# Patient Record
Sex: Male | Born: 1965 | Hispanic: No | Marital: Married | Smoking: Former smoker
Health system: Southern US, Community
[De-identification: ages and names within clinical notes are randomized; demographics above are authoritative.]

## PROBLEM LIST (undated history)

## (undated) DIAGNOSIS — K219 Gastro-esophageal reflux disease without esophagitis: Secondary | ICD-10-CM

## (undated) DIAGNOSIS — F319 Bipolar disorder, unspecified: Secondary | ICD-10-CM

## (undated) DIAGNOSIS — M1990 Unspecified osteoarthritis, unspecified site: Secondary | ICD-10-CM

## (undated) DIAGNOSIS — I2510 Atherosclerotic heart disease of native coronary artery without angina pectoris: Secondary | ICD-10-CM

## (undated) DIAGNOSIS — E785 Hyperlipidemia, unspecified: Secondary | ICD-10-CM

## (undated) DIAGNOSIS — C801 Malignant (primary) neoplasm, unspecified: Secondary | ICD-10-CM

## (undated) HISTORY — PX: KNEE CARTILAGE SURGERY: SHX688

## (undated) HISTORY — PX: COLONOSCOPY W/ BIOPSIES: SHX1374

## (undated) HISTORY — PX: PROSTATECTOMY: SHX69

## (undated) HISTORY — DX: Hyperlipidemia, unspecified: E78.5

## (undated) HISTORY — DX: Atherosclerotic heart disease of native coronary artery without angina pectoris: I25.10

---

## 1999-04-30 ENCOUNTER — Emergency Department (HOSPITAL_COMMUNITY): Admission: EM | Admit: 1999-04-30 | Discharge: 1999-04-30 | Payer: Self-pay | Admitting: Emergency Medicine

## 2002-03-09 ENCOUNTER — Encounter: Admission: RE | Admit: 2002-03-09 | Discharge: 2002-03-09 | Payer: Self-pay | Admitting: Neurology

## 2002-03-09 ENCOUNTER — Encounter: Payer: Self-pay | Admitting: Neurology

## 2006-09-05 ENCOUNTER — Ambulatory Visit (HOSPITAL_COMMUNITY): Admission: RE | Admit: 2006-09-05 | Discharge: 2006-09-05 | Payer: Self-pay | Admitting: *Deleted

## 2006-09-05 ENCOUNTER — Encounter (INDEPENDENT_AMBULATORY_CARE_PROVIDER_SITE_OTHER): Payer: Self-pay | Admitting: *Deleted

## 2007-09-17 ENCOUNTER — Encounter (INDEPENDENT_AMBULATORY_CARE_PROVIDER_SITE_OTHER): Payer: Self-pay | Admitting: *Deleted

## 2007-09-17 ENCOUNTER — Ambulatory Visit (HOSPITAL_COMMUNITY): Admission: RE | Admit: 2007-09-17 | Discharge: 2007-09-17 | Payer: Self-pay | Admitting: *Deleted

## 2008-07-04 ENCOUNTER — Inpatient Hospital Stay (HOSPITAL_COMMUNITY): Admission: RE | Admit: 2008-07-04 | Discharge: 2008-07-05 | Payer: Self-pay | Admitting: Urology

## 2008-07-04 ENCOUNTER — Encounter: Payer: Self-pay | Admitting: Urology

## 2010-04-17 ENCOUNTER — Other Ambulatory Visit: Payer: Self-pay | Admitting: Registered Nurse

## 2010-04-23 LAB — TYPE AND SCREEN
ABO/RH(D): A POS
Antibody Screen: NEGATIVE

## 2010-04-23 LAB — CBC
HCT: 40.5 % (ref 39.0–52.0)
Hemoglobin: 13.9 g/dL (ref 13.0–17.0)
RBC: 4.39 MIL/uL (ref 4.22–5.81)
RDW: 12.8 % (ref 11.5–15.5)
WBC: 5.9 10*3/uL (ref 4.0–10.5)

## 2010-04-23 LAB — BASIC METABOLIC PANEL
Calcium: 9.3 mg/dL (ref 8.4–10.5)
GFR calc Af Amer: >60 mL/min (ref >60)
GFR calc non Af Amer: >60 mL/min (ref >60)
Potassium: 4.2 mEq/L (ref 3.5–5.1)
Sodium: 137 mEq/L (ref 135–145)

## 2010-04-23 LAB — HEMOGLOBIN AND HEMATOCRIT, BLOOD
HCT: 36.3 % — ABNORMAL LOW (ref 39.0–52.0)
Hemoglobin: 12.1 g/dL — ABNORMAL LOW (ref 13.0–17.0)

## 2010-04-23 LAB — ABO/RH: ABO/RH(D): A POS

## 2010-05-29 NOTE — Op Note (Signed)
NAMESIDDH, VANDEVENTER NO.:  [id number]   MEDICAL RECORD NO.:  [id number]          PATIENT TYPE:  AMB   LOCATION:  ENDO                         FACILITY:  [facility]   PHYSICIAN:  Georgiana Spinner, M.D.    DATE OF BIRTH:  09-23-1965   DATE OF PROCEDURE:  09/05/2006  DATE OF DISCHARGE:                               OPERATIVE REPORT   PROCEDURE:  Upper endoscopy.   INDICATIONS:  Hemoccult positivity.   ANESTHESIA:  Fentanyl 100 mcg, Versed 8 mg.   PROCEDURE:  With the patient mildly sedated in the left lateral  decubitus position, the Pentax videoscopic endoscope was inserted in the  mouth and passed under direct vision through the esophagus which  appeared normal.  I went into the stomach, fundus, body, antrum appeared  normal as did duodenal bulb, second portion duodenum.  From this point,  the endoscope was slowly withdrawn taking circumferential views of  duodenal mucosa until the endoscope had been pulled back into the  stomach, placed in retroflexion to view the stomach from below.  The  endoscope was straightened and withdrawn taking circumferential views of  the remaining gastric and esophageal mucosa.  The patient's vital signs  and pulse oximeter remained stable.  The patient tolerated the procedure  well without apparent complication.   FINDINGS:  Negative examination.   PLAN:  Proceed to colonoscopy.           ______________________________  Georgiana Spinner, M.D.     GMO/MEDQ  D:  09/05/2006  T:  09/06/2006  Job:  [id number]

## 2010-05-29 NOTE — Op Note (Signed)
NAMEZACHAREY, JENSEN NO.:  [id number]   MEDICAL RECORD NO.:  [id number]          PATIENT TYPE:  AMB   LOCATION:  ENDO                         FACILITY:  [facility]   PHYSICIAN:  Georgiana Spinner, M.D.    DATE OF BIRTH:  03-14-1965   DATE OF PROCEDURE:  09/05/2006  DATE OF DISCHARGE:                               OPERATIVE REPORT   PROCEDURE:  Colonoscopy.   INDICATIONS:  Rectal bleeding.   ANESTHESIA:  Versed 2 mg.   PROCEDURE:  With the patient mildly sedated in the left lateral  decubitus position, rectal examination was performed.  Prostate was  normal.  Subsequently, the Pentax videoscopic colonoscope was inserted  into the rectum and passed under direct vision to the cecum identified  by ileocecal valve and appendiceal orifice, both of which were  photographed.  From this point the colonoscope was slowly withdrawn  taking circumferential views of the colonic mucosa stopping in the  descending colon where three successive polyps were noted and very close  to each other and all were photographed and all were removed using snare  cautery technique setting of 20/150 blended current and all of them were  retrieved for pathology, the first by suctioning through the endoscope  and the second and third by removing the polypoid tissue with a Lucina Mellow  retrieval net, all were placed in the same bottle and the endoscope was  subsequently then reinserted to this level and withdrawn taking  circumferential views of the remaining colonic mucosa, stopping in the  rectum which appeared normal on direct and showed small hemorrhoids on  retroflexed view.  The endoscope was straightened and withdrawn.  The  patient's vital signs and pulse oximeter remained stable.  The patient  tolerated the procedure well without apparent complications.   FINDINGS:  Polyps of descending colon.  Internal hemorrhoids.  Await  biopsy report.  The patient will call me for results and follow-up with  me  as an outpatient.  Of note, blood was seen emanating from the largest  polyp, indicating this probably was cause of the hemoccult positivity.           ______________________________  Georgiana Spinner, M.D.     GMO/MEDQ  D:  09/05/2006  T:  09/06/2006  Job:  [id number]

## 2010-05-29 NOTE — Op Note (Signed)
NAMEDAMIER, DISANO NO.:  [id number]   MEDICAL RECORD NO.:  [id number]          PATIENT TYPE:  INP   LOCATION:  0003                         FACILITY:  [facility]   PHYSICIAN:  Excell Seltzer. Annabell Howells, M.D.    DATE OF BIRTH:  [birth year]   DATE OF PROCEDURE:  07/04/2008  DATE OF DISCHARGE:                               OPERATIVE REPORT   PROCEDURE:  Robot-assisted laparoscopic radical prostatectomy.   PREOPERATIVE DIAGNOSIS:  T1c, Gleason 6 adenocarcinoma the prostate.   POSTOPERATIVE DIAGNOSIS:  T1c, Gleason 6 adenocarcinoma the prostate.   SURGEON:  Excell Seltzer. Annabell Howells, M.D.   ASSISTANT:  Heloise Purpura, MD.   ANESTHESIA:  General.   SPECIMENS:  Prostate and seminal vesicles.   DRAINS:  A 20-French Foley catheter and Blake wound drain.   ESTIMATED BLOOD LOSS:  200 mL.   COMPLICATIONS:  None.   INDICATIONS:  Dustin Conrad is a 45 year old white male who was initially  referred for an elevated PSA and was found to have a T1c, Gleason 6  prostate cancer involving one of twelve cores at the left apex.  His  prostate volume is 50 mL.  After discussing the options he has elected  robotic a prostatectomy.   FINDINGS OF PROCEDURE:  The patient was given a routine bowel prep.  He  was taken to the operating room where he was placed on the table in a  supine position.  A general anesthetic was induced.  He was given a gram  of Ancef.  He was then placed in the lithotomy position.  His arms were  tucked.  All pressure points were padded.  His abdomen was clipped and a  red rubber rectal catheter was placed and secured and attached to an  Asepto syringe.  He was then prepped with Betadine solution and then  placed in steep Trendelenburg position with Mayo stand over his head.  He was then draped in the usual sterile fashion.  A 20-French Foley  catheter with a 30 mL balloon was placed.  The bladder was drained and  the catheter was placed to irrigation tubing.   At this point, the  initial port site was marked 18 cm superior to the  pubis, just to the left of the midline.  A 2 cm incision was made  through the skin and carried to subcutaneous tissues using the Bovie and  hemostat dissection.  Once the anterior rectus sheath was exposed it was  incised vertically.  We were in the midline, so the peritoneum was  immediately beneath the fascia.  The peritoneum was pierced with a  hemostat.  A finger was placed in the incision to ensure intraperitoneal  positioning.  A 12 mm port was then placed and the skin was tightened  around the port using zero Vicryl figure-of-eight stitch.  The  laparoscopic camera was then placed.  Inspection revealed no adhesions.  The abdomen was insufflated.  The additional port sites were measured  and marked in the standard distribution.  The chosen sites were  infiltrated with local anesthetic and the skin was incised with  a knife,  followed by a Bovie to open the subcutaneous space then sequentially two  8 mm ports were placed on the left.  An 8 mm robot port was placed on  the right along the 12 mm working port laterally and a 5 mm working port  superiorly.  Once all ports were in position the robot was prepared and  brought onto the field and docked.  The cautery scissors were placed in  the right hand.  The PK dissector was placed in the medial left and  working port and a ProGrasp was placed in the lateral left working port.  I then moved to the console and initiated the dissection.  The  peritoneum was incised over the anterior abdominal wall and the  obliterated umbilical arteries and the rectus were divided.  The bladder  was then dissected off the anterior bladder wall.  The bladder was then  filled with fluid to assist with dissection.  Once the pubis was  identified the bladder was drained and the third arm was used to provide  cephalad traction on the bladder.  The anterior prostatic space was  developed and the anterior  prostate was defatted.  The right endopelvic  fascia was incised and separated exposing the lateral prostatic margin.  This was then repeated on the left side.  The puboprostatic ligaments  were taken down and further apical dissection was performed to expose  the dorsal vein complex bilaterally.  Once adequate exposure had been  obtained an Endo-GIA was used to divide the dorsal vein complex.  The  Foley catheter was then used to identify the bladder neck which was  incised transversely.  Once the Foley had been identified at the bladder  neck the balloon was deflated and the catheter was then withdrawn,  replaced and then brought back in to provide anterior traction on the  prostate.  The posterior bladder neck was then divided down to expose  the structures.  The left vas of ampulla was identified, grasped and  dissected proximally and divided and then used to provide anterior  traction in place with of the Foley.  The left seminal vesicle was then  dissected out once the tip was identified.  A clip was placed across the  remaining attachments and the seminal vesicle was first reflected  anteriorly.  The right ampulla of the vas was dissected out divided as  followed by the right seminal vesicle which as once again was controlled  at the tip with a clip.  Once the structures had been dissected out  Denonvilliers sheath was incised, exposing the plane between the  prostate and the rectum which was developed out to the apex and  laterally.  Once this plane had been developed, the structures were  released and the nerve-sparing dissection was performed.  Initially, the  right nerve spare dissection was performed incising the periprostatic  fascia and developing the plane between the prostate and the  neurovascular bundle from base to apex.  Once this dissection been  completed it was repeated on the left side in an identical fashion.  Once both nerve spares had been performed the left  prostatic pedicle was  taken down using large clips with great care being taken to avoid entry  into the prostate or incorporation of the neurovascular bundle.  Once  the pedicle had been divided some additional lateral attachments were  taken down with sharp and blunt dissection toward the apex.  We then  took down  the right prostatic pedicle once again with great care being  taken to avoid incorporation of the neurovascular bundle or entry into  the prostate.  At this point, we turned our attention to the apex where  the residual dorsal vein complex was divided using the cautery scissors.  The urethra was divided anteriorly with cold shears.  The Foley catheter  which had been replaced was then pulled back and the posterior urethra  was divided with cold scissors.  The residual apical attachments were  taken down.  There were large veins that came in laterally at the apex.  These were controlled near the prostate with bipolar cautery.  At this  point, the prostate was removed from the field.  There was a small  arterial bleeder from the left lateral attachments.  This was controlled  with a very superficial 4-0 Vicryl figure-of-eight suture with great  care taken not to deeply incorporate the neurovascular bundle.  At this  point, the bladder was irrigated and aspirated.  No evidence of rectal  injury was identified.   A 2-0 Vicryl was then brought onto the field and was used to place a  stitch through the posterior edge of Denonvilliers fascia, the posterior  bladder neck and the posterior urethra and rectourethral Allis complex.  This was tied using a slip-knot to bring the bladder neck and urethra in  approximation.  Once this suture had been secured a two-tone 4-0  Monocryl was brought onto the field and was used to perform the standard  running vesicourethral anastomosis.  Once the anastomosis had been  completed and tied a fresh 20-French coude Foley catheter was inserted.  The  bladder was irrigated with an Asepto syringe and a small clot was  aspirated, but no anastomotic leak was identified.  At this point, a #19  Blake drain was placed through the left lateral port and the port was  removed.  The robot was then undocked once all hemostasis was assured  and the prostate was placed in an entrapment sac.  Once the prostate had  been placed in the entrapment sac the 12 mm assistant port was closed  using a needle passer and zero Vicryl.  The remaining ports were removed  under direct vision.  The periumbilical incision was then extended  sufficiently to remove the prostate and the anterior rectus fascia was  closed using a running #1 Vicryl suture.  All port sites were  infiltrated with local anesthetic and then were closed with clips.  The  drain was secured with a 3-0 Vicryl and placed to bulb suction.  The  drapes were removed.  Dressings were applied.  The Foley catheter was  irrigated once again with blue urine return from the indigo carmine  given during procedure and then the catheter was placed to straight  drainage and secured to the patient's thigh.  He was taken down from  lithotomy position.  He had moved out of the Trendelenburg position  prior to closure.  His anesthetic was reversed and was moved to the  recovery room in stable condition.  There were no complications.      Excell Seltzer. Annabell Howells, M.D.  Electronically Signed     JJW/MEDQ  D:  07/04/2008  T:  07/04/2008  Job:  [id number]   cc:   Soyla Murphy. Renne Crigler, M.D.  Fax: [phone]

## 2010-05-29 NOTE — Op Note (Signed)
NAMEBOCEPHUS, CALI NO.:  [id number]   MEDICAL RECORD NO.:  [id number]          PATIENT TYPE:  AMB   LOCATION:  ENDO                         FACILITY:  [facility]   PHYSICIAN:  Georgiana Spinner, M.D.    DATE OF BIRTH:  [birth year]   DATE OF PROCEDURE:  09/17/2007  DATE OF DISCHARGE:                               OPERATIVE REPORT   PROCEDURE:  Colonoscopy.   INDICATIONS:  Colon polyps.   ANESTHESIA:  Fentanyl 175 mcg, Versed 15 mg.   PROCEDURE:  With the patient mildly sedated in the left lateral  decubitus position, a rectal exam was performed which was unremarkable  to my exam.  Subsequently, the Pentax videoscopic colonoscope was  inserted in the rectum and passed under direct vision to the cecum  identified by the ileocecal valve and appendiceal orifice, both which  were photographed.  From this point, the colonoscope was slowly  withdrawn taking circumferential views of colonic mucosa stopping in the  descending colon near where a scar was seen from previous polypectomy  site presumably, and an area was noted and explored this fairly closely,  and I thought it was a  polyp stalk possibly from previous polyps, but I  could not see a polyp head.  I biopsied the top of this area.  It had  mottled appearance to it, and once accomplished, we had explored it  fairly thoroughly.  I did not see a polyp top on thorough exam.  I  withdrew the colonoscope taking circumferential views again of the  remaining colonic mucosa, stopping in the rectum, which appeared normal  on direct and showed hemorrhoids on retroflexed view.  The endoscope was  straightened and withdrawn.  The patient's vital signs and pulse  oximeter remained stable.  The patient tolerated the procedure well  without apparent complication.   FINDINGS:  Probably a polyp stalk but no residual polyp tissue seen at  this point.  Await biopsy report.  The patient will call me for results  and follow up with me  as needed as an outpatient.           ______________________________  Georgiana Spinner, M.D.     GMO/MEDQ  D:  09/17/2007  T:  09/17/2007  Job:  [id number]

## 2010-09-07 ENCOUNTER — Other Ambulatory Visit: Payer: Self-pay | Admitting: Gastroenterology

## 2013-06-10 ENCOUNTER — Other Ambulatory Visit: Payer: Self-pay

## 2015-06-30 ENCOUNTER — Emergency Department (HOSPITAL_COMMUNITY)
Admission: EM | Admit: 2015-06-30 | Discharge: 2015-06-30 | Disposition: A | Discharge status: Home / Self Care | Payer: BC Managed Care – PPO | Attending: Emergency Medicine | Admitting: Emergency Medicine

## 2015-06-30 ENCOUNTER — Emergency Department (HOSPITAL_COMMUNITY): Payer: BC Managed Care – PPO

## 2015-06-30 ENCOUNTER — Encounter (HOSPITAL_COMMUNITY): Payer: Self-pay | Admitting: Emergency Medicine

## 2015-06-30 DIAGNOSIS — S42022A Displaced fracture of shaft of left clavicle, initial encounter for closed fracture: Secondary | ICD-10-CM | POA: Insufficient documentation

## 2015-06-30 DIAGNOSIS — Y92828 Other wilderness area as the place of occurrence of the external cause: Secondary | ICD-10-CM | POA: Insufficient documentation

## 2015-06-30 DIAGNOSIS — Z791 Long term (current) use of non-steroidal anti-inflammatories (NSAID): Secondary | ICD-10-CM | POA: Diagnosis not present

## 2015-06-30 DIAGNOSIS — M1990 Unspecified osteoarthritis, unspecified site: Secondary | ICD-10-CM | POA: Diagnosis not present

## 2015-06-30 DIAGNOSIS — Z7951 Long term (current) use of inhaled steroids: Secondary | ICD-10-CM | POA: Diagnosis not present

## 2015-06-30 DIAGNOSIS — Y999 Unspecified external cause status: Secondary | ICD-10-CM | POA: Diagnosis not present

## 2015-06-30 DIAGNOSIS — S4992XA Unspecified injury of left shoulder and upper arm, initial encounter: Secondary | ICD-10-CM | POA: Diagnosis present

## 2015-06-30 DIAGNOSIS — Z79899 Other long term (current) drug therapy: Secondary | ICD-10-CM | POA: Diagnosis not present

## 2015-06-30 DIAGNOSIS — S42002A Fracture of unspecified part of left clavicle, initial encounter for closed fracture: Secondary | ICD-10-CM

## 2015-06-30 DIAGNOSIS — F319 Bipolar disorder, unspecified: Secondary | ICD-10-CM | POA: Diagnosis not present

## 2015-06-30 DIAGNOSIS — Y9355 Activity, bike riding: Secondary | ICD-10-CM | POA: Insufficient documentation

## 2015-06-30 HISTORY — DX: Bipolar disorder, unspecified: F31.9

## 2015-06-30 HISTORY — DX: Unspecified osteoarthritis, unspecified site: M19.90

## 2015-06-30 MED ORDER — HYDROCODONE-ACETAMINOPHEN 5-325 MG PO TABS: 1.0000 | ORAL_TABLET | Freq: Four times a day (QID) | ORAL | Status: DC | PRN

## 2015-06-30 MED ORDER — HYDROCODONE-ACETAMINOPHEN 5-325 MG PO TABS
1.0000 | ORAL_TABLET | Freq: Once | ORAL | Status: AC
  Administered 2015-06-30: 1 via ORAL
  Filled 2015-06-30: qty 1
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
## 2015-06-30 NOTE — ED Notes (Signed)
C collar applied and ice given per Ragland.
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
## 2015-06-30 NOTE — Discharge Instructions (Signed)
Clavicle Fracture A clavicle fracture is a broken collarbone. The collarbone is the long bone that connects your shoulder to your rib cage. A broken collarbone may be treated with a sling, a wrap, or surgery. Treatment depends on whether the broken ends of the bone are out of place or not. HOME CARE  Put ice on the injured area:  Put ice in a plastic bag.  Place a towel between your skin and the bag.  Leave the ice on for 20 minutes, 2-3 times a day.  If you have a wrap or splint:  Wear it all the time, and remove it only to take a bath or shower.  When you bathe or shower, keep your shoulder in the same place as when the sling or wrap is on.  Do not lift your arm.  If you have a wrap:  Another person must tighten it every day.  It should be tight enough to hold your shoulders back.  Make sure you have enough room to put your pointer finger between your body and the strap.  Loosen the wrap right away if you cannot feel your arm or your hands tingle.  Only take medicines as told by your doctor.  Avoid activities that make the injury or pain worse for 4-6 weeks after surgery.  Keep all follow-up appointments. GET HELP IF:  Your medicine is not making you feel less pain.  Your medicine is not making swelling better. GET HELP RIGHT AWAY IF:   Your cannot feel your arm.  Your arm is cold.  Your arm is a lighter color than normal. MAKE SURE YOU:   Understand these instructions.  Will watch your condition.  Will get help right away if you are not doing well or get worse.   This information is not intended to replace advice given to you by your health care provider. Make sure you discuss any questions you have with your health care provider.  Call orthopedics for follow up. Use sling at all times. Take pain meds as directed.    Document Released: 06/19/2007 Document Revised: 01/05/2013 Document Reviewed: 11/23/2012 Elsevier Interactive Patient Education [employer]

## 2015-07-04 ENCOUNTER — Other Ambulatory Visit: Payer: Self-pay | Admitting: Orthopaedic Surgery

## 2015-07-04 MED ORDER — CEFAZOLIN SODIUM-DEXTROSE 2-4 GM/100ML-% IV SOLN
2.0000 g | INTRAVENOUS | Status: AC
  Administered 2015-07-05: 2 g via INTRAVENOUS
  Filled 2015-07-04: qty 100

## 2015-07-04 NOTE — Progress Notes (Signed)
Please complete pt assessment on DOS. Left voice message with pre-op instructions on pt phone. Pt made aware to stop taking Aspirin, vitamins, fish oil and herbal medications. Do not take any NSAIDs ie: Ibuprofen, Advil, Naproxen, BC and Goody Powder, Mobic or any medication containing Aspirin.

## 2015-07-05 ENCOUNTER — Ambulatory Visit (HOSPITAL_COMMUNITY): Payer: BC Managed Care – PPO | Admitting: Anesthesiology

## 2015-07-05 ENCOUNTER — Encounter (HOSPITAL_COMMUNITY): Payer: Self-pay | Admitting: *Deleted

## 2015-07-05 ENCOUNTER — Ambulatory Visit (HOSPITAL_COMMUNITY)
Admission: RE | Admit: 2015-07-05 | Discharge: 2015-07-06 | Disposition: A | Discharge status: Home / Self Care | Payer: BC Managed Care – PPO | Source: Ambulatory Visit | Attending: Orthopaedic Surgery | Admitting: Orthopaedic Surgery

## 2015-07-05 ENCOUNTER — Encounter (HOSPITAL_COMMUNITY)
Admission: RE | Disposition: A | Discharge status: Home / Self Care | Payer: Self-pay | Source: Ambulatory Visit | Attending: Orthopaedic Surgery

## 2015-07-05 ENCOUNTER — Ambulatory Visit (HOSPITAL_COMMUNITY): Payer: BC Managed Care – PPO

## 2015-07-05 DIAGNOSIS — Z87891 Personal history of nicotine dependence: Secondary | ICD-10-CM | POA: Diagnosis not present

## 2015-07-05 DIAGNOSIS — Z419 Encounter for procedure for purposes other than remedying health state, unspecified: Secondary | ICD-10-CM

## 2015-07-05 DIAGNOSIS — M47892 Other spondylosis, cervical region: Secondary | ICD-10-CM | POA: Insufficient documentation

## 2015-07-05 DIAGNOSIS — S42009A Fracture of unspecified part of unspecified clavicle, initial encounter for closed fracture: Secondary | ICD-10-CM | POA: Diagnosis present

## 2015-07-05 DIAGNOSIS — S42002A Fracture of unspecified part of left clavicle, initial encounter for closed fracture: Secondary | ICD-10-CM | POA: Insufficient documentation

## 2015-07-05 HISTORY — DX: Malignant (primary) neoplasm, unspecified: C80.1

## 2015-07-05 HISTORY — PX: ORIF CLAVICULAR FRACTURE: SHX5055

## 2015-07-05 HISTORY — DX: Gastro-esophageal reflux disease without esophagitis: K21.9

## 2015-07-05 LAB — CBC
HEMATOCRIT: 39 % (ref 39.0–52.0)
HEMOGLOBIN: 12.2 g/dL — AB (ref 13.0–17.0)
MCH: 29.5 pg (ref 26.0–34.0)
MCHC: 31.3 g/dL (ref 30.0–36.0)
MCV: 94.4 fL (ref 78.0–100.0)
Platelets: 287 10*3/uL (ref 150–400)
RBC: 4.13 MIL/uL — ABNORMAL LOW (ref 4.22–5.81)
RDW: 13.3 % (ref 11.5–15.5)
WBC: 6.1 10*3/uL (ref 4.0–10.5)

## 2015-07-05 LAB — COMPREHENSIVE METABOLIC PANEL
ALK PHOS: 88 U/L (ref 38–126)
ALT: 37 U/L (ref 17–63)
ANION GAP: 8 (ref 5–15)
AST: 35 U/L (ref 15–41)
Albumin: 3.7 g/dL (ref 3.5–5.0)
BILIRUBIN TOTAL: 0.9 mg/dL (ref 0.3–1.2)
BUN: 17 mg/dL (ref 6–20)
CALCIUM: 9.4 mg/dL (ref 8.9–10.3)
CO2: 24 mmol/L (ref 22–32)
Chloride: 105 mmol/L (ref 101–111)
Creatinine, Ser: 0.95 mg/dL (ref 0.61–1.24)
GFR calc Af Amer: >60 mL/min (ref >60)
Glucose, Bld: 86 mg/dL (ref 65–99)
POTASSIUM: 4.7 mmol/L (ref 3.5–5.1)
Sodium: 137 mmol/L (ref 135–145)
TOTAL PROTEIN: 6.4 g/dL — AB (ref 6.5–8.1)

## 2015-07-05 LAB — PROTIME-INR
INR: 1.09 (ref 0.00–1.49)
PROTHROMBIN TIME: 14.3 s (ref 11.6–15.2)

## 2015-07-05 SURGERY — OPEN REDUCTION INTERNAL FIXATION (ORIF) CLAVICULAR FRACTURE
Anesthesia: General | Laterality: Left

## 2015-07-05 MED ORDER — ONDANSETRON HCL 4 MG/2ML IJ SOLN: 4.0000 mg | Freq: Once | INTRAMUSCULAR | Status: DC | PRN

## 2015-07-05 MED ORDER — MIDAZOLAM HCL 2 MG/2ML IJ SOLN
INTRAMUSCULAR | Status: AC
  Administered 2015-07-05: 1 mg
  Filled 2015-07-05: qty 2

## 2015-07-05 MED ORDER — BUPIVACAINE HCL (PF) 0.25 % IJ SOLN
INTRAMUSCULAR | Status: AC
  Filled 2015-07-05: qty 30

## 2015-07-05 MED ORDER — OXYCODONE HCL 5 MG PO TABS: 5.0000 mg | ORAL_TABLET | Freq: Once | ORAL | Status: DC | PRN

## 2015-07-05 MED ORDER — LIDOCAINE HCL (CARDIAC) 20 MG/ML IV SOLN
INTRAVENOUS | Status: DC | PRN
  Administered 2015-07-05: 80 mg via INTRAVENOUS

## 2015-07-05 MED ORDER — OXYCODONE HCL 5 MG PO TABS
5.0000 mg | ORAL_TABLET | ORAL | Status: DC | PRN
  Administered 2015-07-05 – 2015-07-06 (×4): 10 mg via ORAL
  Filled 2015-07-05 (×3): qty 2

## 2015-07-05 MED ORDER — HYDROMORPHONE HCL 1 MG/ML IJ SOLN: 0.5000 mg | INTRAMUSCULAR | Status: DC | PRN

## 2015-07-05 MED ORDER — METHOCARBAMOL 1000 MG/10ML IJ SOLN: 500.0000 mg | Freq: Four times a day (QID) | INTRAVENOUS | Status: DC | PRN

## 2015-07-05 MED ORDER — ACETAMINOPHEN 650 MG RE SUPP: 650.0000 mg | Freq: Four times a day (QID) | RECTAL | Status: DC | PRN

## 2015-07-05 MED ORDER — MENTHOL 3 MG MT LOZG: 1.0000 | LOZENGE | OROMUCOSAL | Status: DC | PRN

## 2015-07-05 MED ORDER — LACTATED RINGERS IV SOLN
INTRAVENOUS | Status: DC
  Administered 2015-07-05 (×2): via INTRAVENOUS

## 2015-07-05 MED ORDER — CHLORHEXIDINE GLUCONATE 4 % EX LIQD: 60.0000 mL | Freq: Once | CUTANEOUS | Status: DC

## 2015-07-05 MED ORDER — OXYCODONE-ACETAMINOPHEN 5-325 MG PO TABS: 1.0000 | ORAL_TABLET | Freq: Four times a day (QID) | ORAL | Status: DC | PRN

## 2015-07-05 MED ORDER — SUGAMMADEX SODIUM 200 MG/2ML IV SOLN
INTRAVENOUS | Status: AC
  Filled 2015-07-05: qty 2

## 2015-07-05 MED ORDER — ACETAMINOPHEN 325 MG PO TABS
ORAL_TABLET | ORAL | Status: AC
  Administered 2015-07-05: 650 mg via ORAL
  Filled 2015-07-05: qty 2

## 2015-07-05 MED ORDER — METHOCARBAMOL 500 MG PO TABS
500.0000 mg | ORAL_TABLET | Freq: Four times a day (QID) | ORAL | Status: DC | PRN
  Administered 2015-07-05: 500 mg via ORAL
  Filled 2015-07-05: qty 1

## 2015-07-05 MED ORDER — FENTANYL CITRATE (PF) 100 MCG/2ML IJ SOLN
INTRAMUSCULAR | Status: AC
  Administered 2015-07-05: 100 ug
  Filled 2015-07-05: qty 2

## 2015-07-05 MED ORDER — ONDANSETRON HCL 4 MG/2ML IJ SOLN
INTRAMUSCULAR | Status: AC
  Filled 2015-07-05: qty 2

## 2015-07-05 MED ORDER — FLUTICASONE PROPIONATE 50 MCG/ACT NA SUSP
1.0000 | Freq: Every day | NASAL | Status: DC
  Filled 2015-07-05: qty 16

## 2015-07-05 MED ORDER — FENTANYL CITRATE (PF) 100 MCG/2ML IJ SOLN
INTRAMUSCULAR | Status: DC | PRN
  Administered 2015-07-05: 50 ug via INTRAVENOUS
  Administered 2015-07-05 (×2): 100 ug via INTRAVENOUS

## 2015-07-05 MED ORDER — ONDANSETRON HCL 4 MG/2ML IJ SOLN
INTRAMUSCULAR | Status: DC | PRN
  Administered 2015-07-05: 4 mg via INTRAVENOUS

## 2015-07-05 MED ORDER — HYDROMORPHONE HCL 1 MG/ML IJ SOLN: 0.2500 mg | INTRAMUSCULAR | Status: DC | PRN

## 2015-07-05 MED ORDER — MIDAZOLAM HCL 2 MG/2ML IJ SOLN
INTRAMUSCULAR | Status: AC
Start: 2015-07-05 — End: 2015-07-05
  Filled 2015-07-05: qty 2

## 2015-07-05 MED ORDER — ROCURONIUM BROMIDE 100 MG/10ML IV SOLN
INTRAVENOUS | Status: DC | PRN
  Administered 2015-07-05: 50 mg via INTRAVENOUS

## 2015-07-05 MED ORDER — ONDANSETRON HCL 4 MG/2ML IJ SOLN: 4.0000 mg | Freq: Four times a day (QID) | INTRAMUSCULAR | Status: DC | PRN

## 2015-07-05 MED ORDER — BUPIVACAINE HCL (PF) 0.25 % IJ SOLN
INTRAMUSCULAR | Status: DC | PRN
  Administered 2015-07-05: 20 mL

## 2015-07-05 MED ORDER — ONDANSETRON HCL 4 MG PO TABS: 4.0000 mg | ORAL_TABLET | Freq: Four times a day (QID) | ORAL | Status: DC | PRN

## 2015-07-05 MED ORDER — LITHIUM CARBONATE 300 MG PO CAPS
300.0000 mg | ORAL_CAPSULE | Freq: Two times a day (BID) | ORAL | Status: DC
  Administered 2015-07-05: 300 mg via ORAL
  Filled 2015-07-05 (×2): qty 1

## 2015-07-05 MED ORDER — PANTOPRAZOLE SODIUM 40 MG PO TBEC
40.0000 mg | DELAYED_RELEASE_TABLET | Freq: Every day | ORAL | Status: DC
  Filled 2015-07-05: qty 1

## 2015-07-05 MED ORDER — LACTATED RINGERS IV SOLN
INTRAVENOUS | Status: DC | PRN
Start: 2015-07-05 — End: 2015-07-05
  Administered 2015-07-05 (×2): via INTRAVENOUS

## 2015-07-05 MED ORDER — FENTANYL CITRATE (PF) 250 MCG/5ML IJ SOLN
INTRAMUSCULAR | Status: AC
  Filled 2015-07-05: qty 5

## 2015-07-05 MED ORDER — OXYCODONE HCL 5 MG/5ML PO SOLN: 5.0000 mg | Freq: Once | ORAL | Status: DC | PRN

## 2015-07-05 MED ORDER — PROPOFOL 10 MG/ML IV BOLUS
INTRAVENOUS | Status: DC | PRN
  Administered 2015-07-05: 150 mg via INTRAVENOUS

## 2015-07-05 MED ORDER — SUGAMMADEX SODIUM 200 MG/2ML IV SOLN
INTRAVENOUS | Status: DC | PRN
  Administered 2015-07-05: 177 mg via INTRAVENOUS

## 2015-07-05 MED ORDER — PROPOFOL 10 MG/ML IV BOLUS
INTRAVENOUS | Status: AC
  Filled 2015-07-05: qty 20

## 2015-07-05 MED ORDER — CEFAZOLIN IN D5W 1 GM/50ML IV SOLN
1.0000 g | Freq: Three times a day (TID) | INTRAVENOUS | Status: AC
  Administered 2015-07-06 (×2): 1 g via INTRAVENOUS
  Filled 2015-07-05 (×2): qty 50

## 2015-07-05 MED ORDER — OXYCODONE HCL 5 MG PO TABS
ORAL_TABLET | ORAL | Status: AC
  Administered 2015-07-05: 10 mg via ORAL
  Filled 2015-07-05: qty 2

## 2015-07-05 MED ORDER — METOCLOPRAMIDE HCL 5 MG/ML IJ SOLN: 5.0000 mg | Freq: Three times a day (TID) | INTRAMUSCULAR | Status: DC | PRN

## 2015-07-05 MED ORDER — METOCLOPRAMIDE HCL 5 MG PO TABS
5.0000 mg | ORAL_TABLET | Freq: Three times a day (TID) | ORAL | Status: DC | PRN
Start: 2015-07-05 — End: 2015-07-06

## 2015-07-05 MED ORDER — PHENOL 1.4 % MT LIQD: 1.0000 | OROMUCOSAL | Status: DC | PRN

## 2015-07-05 MED ORDER — MIDAZOLAM HCL 5 MG/5ML IJ SOLN
INTRAMUSCULAR | Status: DC | PRN
  Administered 2015-07-05: 2 mg via INTRAVENOUS

## 2015-07-05 MED ORDER — METHOCARBAMOL 500 MG PO TABS
ORAL_TABLET | ORAL | Status: AC
  Administered 2015-07-05: 500 mg
  Filled 2015-07-05: qty 1

## 2015-07-05 MED ORDER — LORATADINE 10 MG PO TABS: 10.0000 mg | ORAL_TABLET | Freq: Every day | ORAL | Status: DC

## 2015-07-05 MED ORDER — ACETAMINOPHEN 325 MG PO TABS
650.0000 mg | ORAL_TABLET | Freq: Four times a day (QID) | ORAL | Status: DC | PRN
  Administered 2015-07-05: 650 mg via ORAL

## 2015-07-05 SURGICAL SUPPLY — 48 items
BIT DRILL 2.5X2.75 QC CALB (BIT) ×2 IMPLANT
COVER SURGICAL LIGHT HANDLE (MISCELLANEOUS) ×2 IMPLANT
DRAPE C-ARM 42X72 X-RAY (DRAPES) ×2 IMPLANT
DRAPE SURG 17X23 STRL (DRAPES) ×2 IMPLANT
DRAPE U-SHAPE 47X51 STRL (DRAPES) ×2 IMPLANT
ELECT REM PT RETURN 9FT ADLT (ELECTROSURGICAL) ×2
ELECTRODE REM PT RTRN 9FT ADLT (ELECTROSURGICAL) ×1 IMPLANT
GAUZE SPONGE 4X4 12PLY STRL (GAUZE/BANDAGES/DRESSINGS) ×2 IMPLANT
GAUZE XEROFORM 1X8 LF (GAUZE/BANDAGES/DRESSINGS) ×2 IMPLANT
GLOVE BIOGEL PI IND STRL 8 (GLOVE) ×2 IMPLANT
GLOVE BIOGEL PI INDICATOR 8 (GLOVE) ×2
GLOVE ORTHO TXT STRL SZ7.5 (GLOVE) ×4 IMPLANT
GOWN STRL REUS W/ TWL LRG LVL3 (GOWN DISPOSABLE) ×1 IMPLANT
GOWN STRL REUS W/ TWL XL LVL3 (GOWN DISPOSABLE) ×1 IMPLANT
GOWN STRL REUS W/TWL 2XL LVL3 (GOWN DISPOSABLE) ×2 IMPLANT
GOWN STRL REUS W/TWL LRG LVL3 (GOWN DISPOSABLE) ×1
GOWN STRL REUS W/TWL XL LVL3 (GOWN DISPOSABLE) ×1
KIT BASIN OR (CUSTOM PROCEDURE TRAY) ×2 IMPLANT
KIT ROOM TURNOVER OR (KITS) ×2 IMPLANT
MANIFOLD NEPTUNE II (INSTRUMENTS) ×2 IMPLANT
NEEDLE HYPO 25GX1X1/2 BEV (NEEDLE) IMPLANT
NS IRRIG 1000ML POUR BTL (IV SOLUTION) ×2 IMPLANT
PACK SHOULDER (CUSTOM PROCEDURE TRAY) ×2 IMPLANT
PAD ARMBOARD 7.5X6 YLW CONV (MISCELLANEOUS) ×4 IMPLANT
PLATE FIBULAR COMP LOCK 10H (Plate) ×2 IMPLANT
SCREW CORT 3.5X26 (Screw) ×1 IMPLANT
SCREW CORT T15 26X3.5XST LCK (Screw) ×1 IMPLANT
SCREW CORT T15 28X3.5XST LCK (Screw) ×1 IMPLANT
SCREW CORT T15 30X3.5XST LCK (Screw) ×1 IMPLANT
SCREW CORTICAL 3.5X28MM (Screw) ×1 IMPLANT
SCREW CORTICAL 3.5X30MM (Screw) ×1 IMPLANT
SCREW CORTICAL LOW PROF 3.5X20 (Screw) ×4 IMPLANT
SCREW LOCK CORT STAR 3.5X14 (Screw) ×2 IMPLANT
SCREW LOCK CORT STAR 3.5X16 (Screw) ×4 IMPLANT
SCREW LOCK CORT STAR 3.5X18 (Screw) ×2 IMPLANT
SCREW LOCK CORT STAR 3.5X22 (Screw) ×2 IMPLANT
SCREW LOW PROFILE 18MMX3.5MM (Screw) ×2 IMPLANT
SPONGE LAP 18X18 X RAY DECT (DISPOSABLE) ×2 IMPLANT
STRIP CLOSURE SKIN 1/2X4 (GAUZE/BANDAGES/DRESSINGS) ×2 IMPLANT
SUCTION FRAZIER HANDLE 10FR (MISCELLANEOUS) ×1
SUCTION TUBE FRAZIER 10FR DISP (MISCELLANEOUS) ×1 IMPLANT
SUT PROLENE 3 0 PS 1 (SUTURE) ×2 IMPLANT
SUT VIC AB 2-0 CT1 27 (SUTURE) ×1
SUT VIC AB 2-0 CT1 TAPERPNT 27 (SUTURE) ×1 IMPLANT
SUT VICRYL 0 CT 1 36IN (SUTURE) ×2 IMPLANT
SYR CONTROL 10ML LL (SYRINGE) IMPLANT
WATER STERILE IRR 1000ML POUR (IV SOLUTION) ×2 IMPLANT
YANKAUER SUCT BULB TIP NO VENT (SUCTIONS) ×2 IMPLANT

## 2015-07-05 NOTE — Transfer of Care (Signed)
Immediate Anesthesia Transfer of Care Note  Patient: Dustin Conrad  Procedure(s) Performed: Procedure(s): OPEN REDUCTION INTERNAL FIXATION (ORIF) CLAVICULAR FRACTURE (Left)  Patient Location: PACU  Anesthesia Type:General  Level of Consciousness: awake  Airway & Oxygen Therapy: Patient Spontanous Breathing  Post-op Assessment: Report given to RN and Post -op Vital signs reviewed and stable  Post vital signs: Reviewed and stable  Last Vitals:  Filed Vitals:   07/05/15 1600 07/05/15 1615  BP: 103/59 102/60  Pulse: 55 43  Temp:    Resp: 21 17    Last Pain: There were no vitals filed for this visit.    Patients Stated Pain Goal: 3 ([id number])  Complications: No apparent anesthesia complications

## 2015-07-05 NOTE — Interval H&P Note (Signed)
History and Physical Interval Note:  07/05/2015 2:41 PM  Dustin Conrad  has presented today for surgery, with the diagnosis of Left Clavicle Fracture  The various methods of treatment have been discussed with the patient and family. After consideration of risks, benefits and other options for treatment, the patient has consented to  Procedure(s): OPEN REDUCTION INTERNAL FIXATION (ORIF) CLAVICULAR FRACTURE (Left) as a surgical intervention .  The patient's history has been reviewed, patient examined, no change in status, stable for surgery.  I have reviewed the patient's chart and labs.  Questions were answered to the patient's satisfaction.     Anda Sobotta C

## 2015-07-05 NOTE — Progress Notes (Signed)
Orthopedic Tech Progress Note Patient Details:  Dustin Conrad Apr 26, 1965 [id number]  Patient ID: Dustin Conrad, male   DOB: 10-05-1965, 50 y.o.   MRN: [id number] Pt already has on sling.   Karolee Stamps 07/05/2015, 10:17 PM

## 2015-07-05 NOTE — Transfer of Care (Deleted)
Immediate Anesthesia Transfer of Care Note  Patient: Dustin Conrad  Procedure(s) Performed: Procedure(s): OPEN REDUCTION INTERNAL FIXATION (ORIF) CLAVICULAR FRACTURE (Left)  Patient Location: PACU  Anesthesia Type:General  Level of Consciousness: sedated  Airway & Oxygen Therapy: Patient Spontanous Breathing and Patient connected to nasal cannula oxygen  Post-op Assessment: Report given to RN and Post -op Vital signs reviewed and stable  Post vital signs: Reviewed and stable  Last Vitals:  Filed Vitals:   07/05/15 1424 07/05/15 1425  BP:  94/51  Pulse:  60  Temp:  37.2 C  Resp: 18 18    Last Pain: There were no vitals filed for this visit.    Patients Stated Pain Goal: 3 ([id number])  Complications: No apparent anesthesia complications

## 2015-07-05 NOTE — Anesthesia Preprocedure Evaluation (Signed)
Anesthesia Evaluation  Patient identified by MRN, date of birth, ID band Patient awake    Reviewed: Allergy & Precautions, NPO status , Patient's Chart, lab work & pertinent test results  Airway Mallampati: II  TM Distance: >3 FB Neck ROM: Full    Dental  (+) Teeth Intact, Dental Advisory Given   Pulmonary former smoker,    breath sounds clear to auscultation       Cardiovascular  Rhythm:Regular Rate:Normal     Neuro/Psych    GI/Hepatic   Endo/Other    Renal/GU      Musculoskeletal   Abdominal   Peds  Hematology   Anesthesia Other Findings   Reproductive/Obstetrics                             Anesthesia Physical Anesthesia Plan  ASA: II  Anesthesia Plan: General   Post-op Pain Management:    Induction: Intravenous  Airway Management Planned: Oral ETT  Additional Equipment:   Intra-op Plan:   Post-operative Plan: Extubation in OR  Informed Consent: I have reviewed the patients History and Physical, chart, labs and discussed the procedure including the risks, benefits and alternatives for the proposed anesthesia with the patient or authorized representative who has indicated his/her understanding and acceptance.   Dental advisory given  Plan Discussed with: CRNA and Anesthesiologist  Anesthesia Plan Comments:         Anesthesia Quick Evaluation  

## 2015-07-05 NOTE — Anesthesia Procedure Notes (Addendum)
Anesthesia Regional Block:  Interscalene brachial plexus block  Pre-Anesthetic Checklist: ,, timeout performed, Correct Patient, Correct Site, Correct Laterality, Correct Procedure, Correct Position, site marked, Risks and benefits discussed,  Surgical consent,  Pre-op evaluation,  At surgeon's request and post-op pain management  Laterality: Left  Prep: chloraprep       Needles:  Injection technique: Single-shot  Needle Type: Echogenic Stimulator Needle     Needle Length: 9cm 9 cm Needle Gauge: 21 and 21 G    Additional Needles: Interscalene brachial plexus block Narrative:  Start time: 07/05/2015 3:40 PM Injection made incrementally with aspirations every 5 mL.  Performed by: Personally   Additional Notes: 20 cc 0.5% Bupivacaine injected easily   Procedure Name: Intubation Date/Time: 07/05/2015 5:12 PM Performed by: Manus Gunning, Jarelly Rinck J Pre-anesthesia Checklist: Timeout performed, Patient identified, Emergency Drugs available, Suction available and Patient being monitored Patient Re-evaluated:Patient Re-evaluated prior to inductionOxygen Delivery Method: Circle system utilized Preoxygenation: Pre-oxygenation with 100% oxygen Intubation Type: IV induction Ventilation: Mask ventilation without difficulty Laryngoscope Size: Mac and 4 Grade View: Grade II Tube type: Oral Tube size: 7.0 mm Number of attempts: 1 Placement Confirmation: ETT inserted through vocal cords under direct vision,  breath sounds checked- equal and bilateral and positive ETCO2 Secured at: 22 cm Tube secured with: Tape Dental Injury: Teeth and Oropharynx as per pre-operative assessment

## 2015-07-05 NOTE — Anesthesia Postprocedure Evaluation (Signed)
Anesthesia Post Note  Patient: Dustin Conrad  Procedure(s) Performed: Procedure(s) (LRB): OPEN REDUCTION INTERNAL FIXATION (ORIF) CLAVICULAR FRACTURE (Left)  Patient location during evaluation: PACU Anesthesia Type: General Level of consciousness: awake Pain management: pain level controlled Respiratory status: spontaneous breathing Cardiovascular status: stable Postop Assessment: no signs of nausea or vomiting Anesthetic complications: no     Last Vitals:  Filed Vitals:   07/05/15 2000 07/05/15 2015  BP:  112/67  Pulse: 41 46  Temp:  36.8 C  Resp: 13 16    Last Pain:  Filed Vitals:   07/05/15 2020  PainSc: 0-No pain   Pain Goal: Patients Stated Pain Goal: 3 (07/05/15 1424)               Atwood Adcock
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
## 2015-07-06 ENCOUNTER — Encounter (HOSPITAL_COMMUNITY): Payer: Self-pay | Admitting: Orthopaedic Surgery

## 2015-07-06 DIAGNOSIS — S42002A Fracture of unspecified part of left clavicle, initial encounter for closed fracture: Secondary | ICD-10-CM | POA: Diagnosis not present

## 2015-07-06 NOTE — Op Note (Signed)
NAMESIMRAN, Dustin Conrad NO.:  [id number]  MEDICAL RECORD NO.:  [id number]  LOCATION:  [id number]                        FACILITY:  [facility]  PHYSICIAN:  Disney Ruggiero C. Lorin Mercy, M.D.    DATE OF BIRTH:  12/25/1965  DATE OF PROCEDURE:  07/05/2015 DATE OF DISCHARGE:                              OPERATIVE REPORT   PREOPERATIVE DIAGNOSIS:  Left comminuted clavicle fracture.  POSTOPERATIVE DIAGNOSIS:  Left comminuted clavicle fracture.  PROCEDURE:  Open reduction and internal fixation of left clavicle fracture.  IMPLANTS:  Biomet composite plate with locking and nonlocking screws 10 hole plate.  SURGEON:  Docia Klar C. Lorin Mercy, M.D.  ASSISTANT:  April Fulp, RNFA.  ANESTHESIA:  General plus preoperative block and 5 mL Marcaine local.  PROCEDURE IN DETAIL:  After induction of general anesthesia, with the patient in the beach chair position, the arm was kept on the table with padding over the ulnar nerve, and a 10-15 drape was placed from the deltoid across to the sternoclavicular joint and a 10-10 drape isolating the neck and head.  The area was prepped with DuraPrep, sterile skin marker, split sheets, drapes, Betadine, Steri-Drape with time-out procedure.  Incision was made after Ancef prophylaxis, subperiosteal dissection fracture isolating the medial and lateral fragments first and then to the midline where there was comminution.  There was a 4 cm spike present on the lateral fragment, and it was pulled anteriorly, and the medial fragment pushed posteriorly until it was aligned out to length, self-retaining clamp, the 10-hole plate was selected, custom bent placed, and locking screw areas were placed laterally.  Initially, the medial side was fixed with bicortical screws, mostly nonlocking and then a nonlocking screw was used to pull the plate down laterally in the anterior band, it was all placed across the anterior aspect of the clavicle, so it would not be prominent.  The  coracoclavicular ligaments were not stripped off and plate was placed over them.  Once the remaining screws were filled, the initial first screw that was used to pull the plate down which was too long by 4 mm, since it sucked the plate down, it was removed and replaced with a locking screw.  All screw holes were filled.  Final spot pictures were taken confirming good position and alignment and then irrigation and standard closure with 0 Vicryl in the fascia, reapproximating trapezius and pectoralis muscles, 2-0 Vicryl in the subcutaneous tissue, subcuticular skin closure with Marcaine infiltration, tincture of benzoin, Steri-Strips, postop dressing, and sling.  Instrument count and needle count were correct.     Lauria Depoy C. Lorin Mercy, M.D.     MCY/MEDQ  D:  07/05/2015  T:  07/05/2015  Job:  [id number]

## 2015-07-06 NOTE — Progress Notes (Signed)
Subjective: 1 Day Post-Op Procedure(s) (LRB): OPEN REDUCTION INTERNAL FIXATION (ORIF) CLAVICULAR FRACTURE (Left) Patient reports pain as mild.    Objective: Vital signs in last 24 hours: Temp:  [98.1 F (36.7 C)-98.9 F (37.2 C)] 98.6 F (37 C) (06/22 0429) Pulse Rate:  [37-65] 48 (06/22 0429) Resp:  [11-23] 16 (06/22 0429) BP: (92-128)/(50-72) 115/67 mmHg (06/22 0429) SpO2:  [97 %-100 %] 98 % (06/22 0429) Weight:  [88.451 kg (195 lb)] 88.451 kg (195 lb) (06/21 1424)  Intake/Output from previous day: 06/21 0701 - 06/22 0700 In: 1300 [I.V.:1300] Out: 10 [Blood:10] Intake/Output this shift:     Recent Labs  07/05/15 1405  HGB 12.2*    Recent Labs  07/05/15 1405  WBC 6.1  RBC 4.13*  HCT 39.0  PLT 287    Recent Labs  07/05/15 1405  NA 137  K 4.7  CL 105  CO2 24  BUN 17  CREATININE 0.95  GLUCOSE 86  CALCIUM 9.4    Recent Labs  07/05/15 1405  INR 1.09    Neurologically intact  Assessment/Plan: 1 Day Post-Op Procedure(s) (LRB): OPEN REDUCTION INTERNAL FIXATION (ORIF) CLAVICULAR FRACTURE (Left) Discharge this AM  Sierra Bissonette C 07/06/2015, 7:57 AM

## 2019-03-01 ENCOUNTER — Ambulatory Visit: Payer: BC Managed Care – PPO | Attending: Internal Medicine

## 2019-03-01 DIAGNOSIS — Z23 Encounter for immunization: Secondary | ICD-10-CM | POA: Insufficient documentation

## 2019-03-01 NOTE — Progress Notes (Signed)
   Covid-19 Vaccination Clinic  Name:  Dustin Conrad    MRN: [id number] DOB: Jan 26, 1965  03/01/2019  Dustin Conrad was observed post Covid-19 immunization for 15 minutes without incidence. He was provided with Vaccine Information Sheet and instruction to access the V-Safe system.   Dustin Conrad was instructed to call 911 with any severe reactions post vaccine: Marland Kitchen Difficulty breathing  . Swelling of your face and throat  . A fast heartbeat  . A bad rash all over your body  . Dizziness and weakness    Immunizations Administered    Name Date Dose VIS Date Route   Pfizer COVID-19 Vaccine 03/01/2019  6:53 PM 0.3 mL 12/25/2018 Intramuscular   Manufacturer: [place]   Lot: [id number]   [place]: [id number]

## 2019-03-23 ENCOUNTER — Ambulatory Visit: Payer: BC Managed Care – PPO | Attending: Internal Medicine

## 2019-03-23 DIAGNOSIS — Z23 Encounter for immunization: Secondary | ICD-10-CM | POA: Insufficient documentation

## 2019-03-23 NOTE — Progress Notes (Signed)
   Covid-19 Vaccination Clinic  Name:  Dustin Conrad    MRN: [id number] DOB: October 01, 1965  03/23/2019  [name] was observed post Covid-19 immunization for 15 minutes without incident. He was provided with Vaccine Information Sheet and instruction to access the V-Safe system.   [name] was instructed to call 911 with any severe reactions post vaccine: Marland Kitchen Difficulty breathing  . Swelling of face and throat  . A fast heartbeat  . A bad rash all over body  . Dizziness and weakness   Immunizations Administered    Name Date Dose VIS Date Route   Pfizer COVID-19 Vaccine 03/23/2019  5:46 PM 0.3 mL 12/25/2018 Intramuscular   Manufacturer: [place]   Lot: [id number]   [place]: [id number]

## 2019-03-24 ENCOUNTER — Ambulatory Visit: Payer: BC Managed Care – PPO

## 2020-09-22 ENCOUNTER — Other Ambulatory Visit: Payer: Self-pay | Admitting: Internal Medicine

## 2020-09-22 DIAGNOSIS — E7800 Pure hypercholesterolemia, unspecified: Secondary | ICD-10-CM

## 2020-10-13 ENCOUNTER — Ambulatory Visit
Admission: RE | Admit: 2020-10-13 | Discharge: 2020-10-13 | Disposition: A | Discharge status: Home / Self Care | Payer: No Typology Code available for payment source | Source: Ambulatory Visit | Attending: Internal Medicine | Admitting: Internal Medicine

## 2020-10-13 DIAGNOSIS — E7800 Pure hypercholesterolemia, unspecified: Secondary | ICD-10-CM

## 2020-11-03 ENCOUNTER — Encounter: Payer: Self-pay | Admitting: Cardiology

## 2020-11-03 ENCOUNTER — Other Ambulatory Visit: Payer: Self-pay

## 2020-11-03 ENCOUNTER — Ambulatory Visit: Payer: BC Managed Care – PPO | Admitting: Cardiology

## 2020-11-03 VITALS — BP 133/82 | HR 71 | Temp 98.0°F | Resp 16 | Ht 73.0 in | Wt 209.0 lb

## 2020-11-03 DIAGNOSIS — I2510 Atherosclerotic heart disease of native coronary artery without angina pectoris: Secondary | ICD-10-CM

## 2020-11-03 DIAGNOSIS — E7800 Pure hypercholesterolemia, unspecified: Secondary | ICD-10-CM

## 2020-11-03 DIAGNOSIS — R931 Abnormal findings on diagnostic imaging of heart and coronary circulation: Secondary | ICD-10-CM

## 2020-11-03 DIAGNOSIS — Z87891 Personal history of nicotine dependence: Secondary | ICD-10-CM

## 2020-11-03 MED ORDER — ASPIRIN EC 81 MG PO TBEC: 81.0000 mg | DELAYED_RELEASE_TABLET | Freq: Every day | ORAL | 11 refills | Status: DC

## 2020-11-03 MED ORDER — ROSUVASTATIN CALCIUM 10 MG PO TABS: 10.0000 mg | ORAL_TABLET | Freq: Every day | ORAL | 0 refills | Status: DC

## 2020-11-03 NOTE — Progress Notes (Signed)
Date:  11/03/2020   ID:  Dreshaun Stene, DOB [birth year], MRN [id number]  PCP:  Deland Pretty, MD  Cardiologist:  Rex Kras, DO, [facility] (established care 11/03/20)  REASON FOR CONSULT: Coronary artery calcification.   REQUESTING PHYSICIAN:  Deland Pretty, MD [address] [place],  [place] [address]  Chief Complaint  Patient presents with   Agatston coronary artery calcium score between 100 and 199   New Patient (Initial Visit)    HPI  Dustin Conrad is a 55 y.o. male who presents to the office with a chief complaint of "coronary calcium." Patient's past medical history and cardiovascular risk factors include: hx of prostate cancer, HLD, moderate CAC  (114AU, 79th percentile), Bipolar disorder.   He is referred to the office at the request of Deland Pretty, MD for evaluation of coronary artery calcification.  Patient is noted to have hyperlipidemia and was managing his cholesterol with lifestyle changes.  The shared decision between him and the primary care provider was to proceed with coronary calcium score for further risk stratification.  Patient was noted to have moderate coronary artery calcification with a total CAC of 114 AU, 79th percentile.  And now referred to cardiology for further evaluation and management.  Patient denies any chest pain or anginal equivalent.  He is overall euvolemic and not in congestive heart failure.  FUNCTIONAL STATUS: Yoga everyday for 35 minutes.    ALLERGIES: No Known Allergies  MEDICATION LIST PRIOR TO VISIT: Current Meds  Medication Sig   ARIPiprazole (ABILIFY) 10 MG tablet Take 10 mg by mouth every morning.   aspirin EC 81 MG tablet Take 1 tablet (81 mg total) by mouth daily. Swallow whole.   cetirizine (ZYRTEC) 10 MG tablet Take 10 mg by mouth daily.   cholecalciferol (VITAMIN D) 400 units TABS tablet Take 400 Units by mouth daily.   fluticasone (FLONASE) 50 MCG/ACT nasal spray Place 1 spray into both nostrils daily.    Glucosamine-Chondroit-Vit C-Mn (GLUCOSAMINE 1500 COMPLEX PO) Take by mouth.   lithium carbonate 300 MG capsule Take 300 mg by mouth 2 (two) times daily.   Loratadine 10 MG CAPS Take 1 capsule by mouth daily at 12 noon.   meloxicam (MOBIC) 15 MG tablet Take 15 mg by mouth daily.   pantoprazole (PROTONIX) 40 MG tablet Take 40 mg by mouth daily.   rosuvastatin (CRESTOR) 10 MG tablet Take 1 tablet (10 mg total) by mouth at bedtime.   sildenafil (REVATIO) 20 MG tablet TAKE 2 TO 5 TABLETS BY MOUTH ONCE A DAY AS NEEDED   tamsulosin (FLOMAX) 0.4 MG CAPS capsule Take 0.4 mg by mouth daily.   zolpidem (AMBIEN CR) 12.5 MG CR tablet Take 1 tablet by mouth at bedtime as needed.     PAST MEDICAL HISTORY: Past Medical History:  Diagnosis Date   Arthritis    Bipolar 1 disorder ([place])    Cancer ([place])    prostate   Coronary atherosclerosis due to calcified coronary lesion    GERD (gastroesophageal reflux disease)    Hyperlipidemia     PAST SURGICAL HISTORY: Past Surgical History:  Procedure Laterality Date   COLONOSCOPY W/ BIOPSIES     KNEE CARTILAGE SURGERY Left 11 years ago   ORIF CLAVICULAR FRACTURE Left 07/05/2015   Procedure: OPEN REDUCTION INTERNAL FIXATION (ORIF) CLAVICULAR FRACTURE;  Surgeon: Marybelle Killings, MD;  Location: [facility];  Service: Orthopedics;  Laterality: Left;   PROSTATECTOMY  9 years ago    FAMILY HISTORY: The patient family  history includes Heart disease in his mother; Stroke in his father.  SOCIAL HISTORY:  The patient  reports that he quit smoking about 16 years ago. His smoking use included cigarettes. He has a 25.00 pack-year smoking history. He has never used smokeless tobacco. He reports that he does not drink alcohol and does not use drugs.  REVIEW OF SYSTEMS: Review of Systems  Constitutional: Negative for chills and fever.  HENT:  Negative for hoarse voice and nosebleeds.   Eyes:  Negative for discharge, double vision and pain.  Cardiovascular:  Negative for chest  pain, claudication, dyspnea on exertion, leg swelling, near-syncope, orthopnea, palpitations, paroxysmal nocturnal dyspnea and syncope.  Respiratory:  Negative for hemoptysis and shortness of breath.   Musculoskeletal:  Negative for muscle cramps and myalgias.  Gastrointestinal:  Negative for abdominal pain, constipation, diarrhea, hematemesis, hematochezia, melena, nausea and vomiting.  Neurological:  Negative for dizziness and light-headedness.   PHYSICAL EXAM: Vitals with BMI 11/03/2020 07/06/2015 07/06/2015  Height 6' 1"  - -  Weight 209 lbs - -  BMI 36.62 - -  Systolic 947 654 650  Diastolic 82 67 62  Pulse 71 48 56    CONSTITUTIONAL: Well-developed and well-nourished. No acute distress.  SKIN: Skin is warm and dry. No rash noted. No cyanosis. No pallor. No jaundice HEAD: Normocephalic and atraumatic.  EYES: No scleral icterus MOUTH/THROAT: Moist oral membranes.  NECK: No JVD present. No thyromegaly noted. No carotid bruits  LYMPHATIC: No visible cervical adenopathy.  CHEST Normal respiratory effort. No intercostal retractions  LUNGS: Clear to auscultation bilaterally. No stridor. No wheezes. No rales.  CARDIOVASCULAR: Regular rate and rhythm, positive S1-S2, no murmurs rubs or gallops appreciated. ABDOMINAL: No apparent ascites.  EXTREMITIES: No peripheral edema  HEMATOLOGIC: No significant bruising NEUROLOGIC: Oriented to person, place, and time. Nonfocal. Normal muscle tone.  PSYCHIATRIC: Normal mood and affect. Normal behavior. Cooperative  CARDIAC DATABASE: EKG: 11/03/2020: Sinus  Rhythm, 67bpm, normal axis, without underling injury pattern, TWI anteroseptal leads, no significant change compared to prior dated 07/05/2015.   Echocardiogram: No results found for this or any previous visit from the past 1095 days.    Stress Testing: No results found for this or any previous visit from the past 1095 days.   Heart Catheterization: None  LABORATORY DATA: CBC Latest  Ref Rng & Units 07/05/2015 07/05/2008 06/28/2008  WBC 4.0 - 10.5 K/uL 6.1 - 5.9  Hemoglobin 13.0 - 17.0 g/dL 12.2(L) 12.1(L) 13.9  Hematocrit 39.0 - 52.0 % 39.0 36.3(L) 40.5  Platelets 150 - 400 K/uL 287 - 336    CMP Latest Ref Rng & Units 07/05/2015 06/28/2008  Glucose 65 - 99 mg/dL 86 106(H)  BUN 6 - 20 mg/dL 17 12  Creatinine 0.61 - 1.24 mg/dL 0.95 0.89  Sodium 135 - 145 mmol/L 137 137  Potassium 3.5 - 5.1 mmol/L 4.7 4.2  Chloride 101 - 111 mmol/L 105 105  CO2 22 - 32 mmol/L 24 27  Calcium 8.9 - 10.3 mg/dL 9.4 9.3  Total Protein 6.5 - 8.1 g/dL 6.4(L) -  Total Bilirubin 0.3 - 1.2 mg/dL 0.9 -  Alkaline Phos 38 - 126 U/L 88 -  AST 15 - 41 U/L 35 -  ALT 17 - 63 U/L 37 -    Lipid Panel  No results found for: CHOL, TRIG, HDL, CHOLHDL, VLDL, LDLCALC, LDLDIRECT, LABVLDL  No components found for: NTPROBNP No results for input(s): PROBNP in the last 8760 hours. No results for input(s): TSH in the last 8760  hours.  BMP No results for input(s): NA, K, CL, CO2, GLUCOSE, BUN, CREATININE, CALCIUM, GFRNONAA, GFRAA in the last 8760 hours.  HEMOGLOBIN A1C No results found for: HGBA1C, MPG  IMPRESSION:    ICD-10-CM   1. Agatston coronary artery calcium score between 100 and 199  R93.1 EKG 12-Lead    PCV MYOCARDIAL PERFUSION WO LEXISCAN    PCV ECHOCARDIOGRAM COMPLETE    aspirin EC 81 MG tablet    rosuvastatin (CRESTOR) 10 MG tablet    2. Coronary atherosclerosis due to calcified coronary lesion  I25.10 PCV MYOCARDIAL PERFUSION WO LEXISCAN   I25.84 PCV ECHOCARDIOGRAM COMPLETE    aspirin EC 81 MG tablet    rosuvastatin (CRESTOR) 10 MG tablet    3. Pure hypercholesterolemia  E78.00 PCV MYOCARDIAL PERFUSION WO LEXISCAN    PCV ECHOCARDIOGRAM COMPLETE    Lipid Panel With LDL/HDL Ratio    LDL cholesterol, direct    CMP14+EGFR    4. Former smoker  Z87.891        RECOMMENDATIONS: Dustin Conrad is a 55 y.o. male whose past medical history and cardiac risk factors include: hx of prostate  cancer, HLD, moderate CAC  (114AU, 79th percentile), Bipolar disorder.   Agatston coronary artery calcium score between 100 and 199 Moderate coronary artery calcification, total CAC 114 AU, 79th percentile Start aspirin 81 mg p.o. daily. Start Crestor 10 mg p.o. nightly medication profile discussed with the patient at today's visit. Repeat fasting lipid profile and CMP in 6 weeks to reevaluate his lipids and liver function. Echocardiogram will be ordered to evaluate for structural heart disease and left ventricular systolic function. Plan nuclear stress test to evaluate for reversible ischemia Educated on the importance of improving his modifiable cardiovascular risk factors.  Coronary atherosclerosis due to calcified coronary lesion See above  Pure hypercholesterolemia Patient is agreeable to start Crestor 10 mg p.o. nightly. Repeat labs in 6 weeks to reevaluate therapy Close follow-up  Former smoker Educated on the importance of continued smoking cessation.  As part of this initial consultation reviewed outside records provided by PCP which included office note, labs these findings have been summarized and noted above for further reference.  Discussed disease management, ordering diagnostic testing, coordination of care and patient education provided as a part of today's encounter.   FINAL MEDICATION LIST END OF ENCOUNTER: Meds ordered this encounter  Medications   aspirin EC 81 MG tablet    Sig: Take 1 tablet (81 mg total) by mouth daily. Swallow whole.    Dispense:  30 tablet    Refill:  11   rosuvastatin (CRESTOR) 10 MG tablet    Sig: Take 1 tablet (10 mg total) by mouth at bedtime.    Dispense:  90 tablet    Refill:  0    Medications Discontinued During This Encounter  Medication Reason   metaxalone (SKELAXIN) 800 MG tablet Error   oxyCODONE-acetaminophen (ROXICET) 5-325 MG tablet Error     Current Outpatient Medications:    ARIPiprazole (ABILIFY) 10 MG tablet, Take  10 mg by mouth every morning., Disp: , Rfl:    aspirin EC 81 MG tablet, Take 1 tablet (81 mg total) by mouth daily. Swallow whole., Disp: 30 tablet, Rfl: 11   cetirizine (ZYRTEC) 10 MG tablet, Take 10 mg by mouth daily., Disp: , Rfl:    cholecalciferol (VITAMIN D) 400 units TABS tablet, Take 400 Units by mouth daily., Disp: , Rfl:    fluticasone (FLONASE) 50 MCG/ACT nasal spray, Place 1 spray into  both nostrils daily., Disp: , Rfl:    Glucosamine-Chondroit-Vit C-Mn (GLUCOSAMINE 1500 COMPLEX PO), Take by mouth., Disp: , Rfl:    lithium carbonate 300 MG capsule, Take 300 mg by mouth 2 (two) times daily., Disp: , Rfl:    Loratadine 10 MG CAPS, Take 1 capsule by mouth daily at 12 noon., Disp: , Rfl:    meloxicam (MOBIC) 15 MG tablet, Take 15 mg by mouth daily., Disp: , Rfl:    pantoprazole (PROTONIX) 40 MG tablet, Take 40 mg by mouth daily., Disp: , Rfl:    rosuvastatin (CRESTOR) 10 MG tablet, Take 1 tablet (10 mg total) by mouth at bedtime., Disp: 90 tablet, Rfl: 0   sildenafil (REVATIO) 20 MG tablet, TAKE 2 TO 5 TABLETS BY MOUTH ONCE A DAY AS NEEDED, Disp: , Rfl:    tamsulosin (FLOMAX) 0.4 MG CAPS capsule, Take 0.4 mg by mouth daily., Disp: , Rfl:    zolpidem (AMBIEN CR) 12.5 MG CR tablet, Take 1 tablet by mouth at bedtime as needed., Disp: , Rfl:   Orders Placed This Encounter  Procedures   Lipid Panel With LDL/HDL Ratio   LDL cholesterol, direct   CMP14+EGFR   PCV MYOCARDIAL PERFUSION WO LEXISCAN   EKG 12-Lead   PCV ECHOCARDIOGRAM COMPLETE    There are no Patient Instructions on file for this visit.   --Continue cardiac medications as reconciled in final medication list. --Return in about 7 weeks (around 12/22/2020) for Follow up, Coronary artery calcification, Lipid. Or sooner if needed. --Continue follow-up with your primary care physician regarding the management of your other chronic comorbid conditions.  Patient's questions and concerns were addressed to his satisfaction. He voices  understanding of the instructions provided during this encounter.   This note was created using a voice recognition software as a result there may be grammatical errors inadvertently enclosed that do not reflect the nature of this encounter. Every attempt is made to correct such errors.  Rex Kras, [place], [facility]  Pager: [phone] Office: [phone]

## 2020-11-13 ENCOUNTER — Ambulatory Visit: Payer: BC Managed Care – PPO

## 2020-11-13 ENCOUNTER — Other Ambulatory Visit: Payer: Self-pay

## 2020-11-13 DIAGNOSIS — E7800 Pure hypercholesterolemia, unspecified: Secondary | ICD-10-CM

## 2020-11-13 DIAGNOSIS — I2584 Coronary atherosclerosis due to calcified coronary lesion: Secondary | ICD-10-CM

## 2020-11-13 DIAGNOSIS — R931 Abnormal findings on diagnostic imaging of heart and coronary circulation: Secondary | ICD-10-CM

## 2020-11-13 DIAGNOSIS — I2510 Atherosclerotic heart disease of native coronary artery without angina pectoris: Secondary | ICD-10-CM

## 2020-11-13 LAB — PCV MYOCARDIAL PERFUSION WO LEXISCAN: ST Depression (mm): 0 mm

## 2020-11-27 ENCOUNTER — Other Ambulatory Visit: Payer: Self-pay | Admitting: Cardiology

## 2020-11-27 DIAGNOSIS — R931 Abnormal findings on diagnostic imaging of heart and coronary circulation: Secondary | ICD-10-CM

## 2020-11-27 DIAGNOSIS — I2510 Atherosclerotic heart disease of native coronary artery without angina pectoris: Secondary | ICD-10-CM

## 2020-11-29 ENCOUNTER — Other Ambulatory Visit: Payer: Self-pay

## 2020-11-29 DIAGNOSIS — E7800 Pure hypercholesterolemia, unspecified: Secondary | ICD-10-CM

## 2020-12-12 LAB — CMP14+EGFR
ALT: 19 IU/L (ref 0–44)
AST: 28 IU/L (ref 0–40)
Albumin/Globulin Ratio: 2.8 — ABNORMAL HIGH (ref 1.2–2.2)
Albumin: 5.1 g/dL — ABNORMAL HIGH (ref 3.8–4.9)
Alkaline Phosphatase: 65 IU/L (ref 44–121)
BUN/Creatinine Ratio: 9 (ref 9–20)
BUN: 11 mg/dL (ref 6–24)
Bilirubin Total: 0.4 mg/dL (ref 0.0–1.2)
CO2: 22 mmol/L (ref 20–29)
Calcium: 9.7 mg/dL (ref 8.7–10.2)
Chloride: 105 mmol/L (ref 96–106)
Creatinine, Ser: 1.29 mg/dL — ABNORMAL HIGH (ref 0.76–1.27)
Globulin, Total: 1.8 g/dL (ref 1.5–4.5)
Glucose: 113 mg/dL — ABNORMAL HIGH (ref 70–99)
Potassium: 4.7 mmol/L (ref 3.5–5.2)
Sodium: 140 mmol/L (ref 134–144)
Total Protein: 6.9 g/dL (ref 6.0–8.5)
eGFR: 65 mL/min/{1.73_m2} (ref >59)

## 2020-12-12 LAB — LIPID PANEL WITH LDL/HDL RATIO
Cholesterol, Total: 171 mg/dL (ref 100–199)
HDL: 54 mg/dL (ref >39)
LDL Chol Calc (NIH): 105 mg/dL — ABNORMAL HIGH (ref 0–99)
LDL/HDL Ratio: 1.9 ratio (ref 0.0–3.6)
Triglycerides: 60 mg/dL (ref 0–149)
VLDL Cholesterol Cal: 12 mg/dL (ref 5–40)

## 2020-12-12 LAB — LDL CHOLESTEROL, DIRECT: LDL Direct: 110 mg/dL — ABNORMAL HIGH (ref 0–99)

## 2020-12-13 NOTE — Progress Notes (Signed)
Attempted to call pt, no answer. Left vm requesting call back.

## 2020-12-14 NOTE — Progress Notes (Signed)
Second attempt to contact patient. No answer, left vm requesting call back.

## 2020-12-15 NOTE — Progress Notes (Signed)
Called and spoke with patient, he is aware of upcoming appointment, and will bring in all his medications.

## 2020-12-22 ENCOUNTER — Encounter: Payer: Self-pay | Admitting: Cardiology

## 2020-12-22 ENCOUNTER — Other Ambulatory Visit: Payer: Self-pay

## 2020-12-22 ENCOUNTER — Ambulatory Visit: Payer: BC Managed Care – PPO | Admitting: Cardiology

## 2020-12-22 VITALS — BP 132/77 | HR 78 | Resp 16 | Ht 73.0 in | Wt 211.0 lb

## 2020-12-22 DIAGNOSIS — Z87891 Personal history of nicotine dependence: Secondary | ICD-10-CM

## 2020-12-22 DIAGNOSIS — E7800 Pure hypercholesterolemia, unspecified: Secondary | ICD-10-CM

## 2020-12-22 DIAGNOSIS — R931 Abnormal findings on diagnostic imaging of heart and coronary circulation: Secondary | ICD-10-CM

## 2020-12-22 DIAGNOSIS — I2510 Atherosclerotic heart disease of native coronary artery without angina pectoris: Secondary | ICD-10-CM

## 2020-12-22 DIAGNOSIS — I2584 Coronary atherosclerosis due to calcified coronary lesion: Secondary | ICD-10-CM

## 2020-12-22 MED ORDER — ROSUVASTATIN CALCIUM 20 MG PO TABS: 20.0000 mg | ORAL_TABLET | Freq: Every day | ORAL | 0 refills | Status: DC

## 2020-12-22 NOTE — Progress Notes (Signed)
ID:  Dustin Conrad, DOB [birth year], MRN [id number]  PCP:  Deland Pretty, MD  Cardiologist:  Rex Kras, DO, [facility] (established care 11/03/20)  Date: 12/22/20 Last Office Visit: 11/03/2020  Chief Complaint  Patient presents with   Coronary artery calcification   Hyperlipidemia   Follow-up   Results    HPI  Dustin Conrad is a 55 y.o. male who presents to the office with a chief complaint of "follow-up for coronary artery calcification management and to discuss test results." Patient's past medical history and cardiovascular risk factors include: hx of prostate cancer, HLD, moderate CAC  (114AU, 79th percentile), Bipolar disorder.   He is referred to the office at the request of Deland Pretty, MD for evaluation of coronary artery calcification.  Patient has no history of hyperlipidemia and has been managing it with lifestyle changes.  Until recently he and his PCP decided to proceed with coronary calcium score for further risk stratification.  Patient was noted to have a total CAC of 114 placing him at the 79th percentile and now referred to cardiology for further evaluation and management.  At the last office visit patient did not complain of any anginal discomfort/equivalents.  However given his hyperlipidemia and CAC shared decision was to proceed with echocardiogram and stress test along with starting him on aspirin 81 mg p.o. daily and Crestor.  He had repeat blood work 12/11/2020 which was independently reviewed and noted below for further reference.  FUNCTIONAL STATUS: Yoga everyday for 35 minutes.    ALLERGIES: No Known Allergies  MEDICATION LIST PRIOR TO VISIT: Current Meds  Medication Sig   ARIPiprazole (ABILIFY) 10 MG tablet Take 10 mg by mouth every morning.   aspirin EC 81 MG tablet Take 1 tablet (81 mg total) by mouth daily. Swallow whole.   cetirizine (ZYRTEC) 10 MG tablet Take 10 mg by mouth daily.   cholecalciferol (VITAMIN D) 400 units TABS tablet Take 400 Units  by mouth daily.   fluticasone (FLONASE) 50 MCG/ACT nasal spray Place 1 spray into both nostrils daily.   Glucosamine-Chondroit-Vit C-Mn (GLUCOSAMINE 1500 COMPLEX PO) Take by mouth.   lithium carbonate 300 MG capsule Take 300 mg by mouth 2 (two) times daily.   Loratadine 10 MG CAPS Take 1 capsule by mouth daily at 12 noon.   meloxicam (MOBIC) 15 MG tablet Take 15 mg by mouth daily.   pantoprazole (PROTONIX) 40 MG tablet Take 40 mg by mouth daily.   sildenafil (REVATIO) 20 MG tablet TAKE 2 TO 5 TABLETS BY MOUTH ONCE A DAY AS NEEDED   tamsulosin (FLOMAX) 0.4 MG CAPS capsule Take 0.4 mg by mouth daily.   zolpidem (AMBIEN CR) 12.5 MG CR tablet Take 1 tablet by mouth at bedtime as needed.   [DISCONTINUED] rosuvastatin (CRESTOR) 10 MG tablet TAKE 1 TABLET(10 MG) BY MOUTH AT BEDTIME     PAST MEDICAL HISTORY: Past Medical History:  Diagnosis Date   Arthritis    Bipolar 1 disorder ([place])    Cancer ([place])    prostate   Coronary atherosclerosis due to calcified coronary lesion    GERD (gastroesophageal reflux disease)    Hyperlipidemia     PAST SURGICAL HISTORY: Past Surgical History:  Procedure Laterality Date   COLONOSCOPY W/ BIOPSIES     KNEE CARTILAGE SURGERY Left 11 years ago   ORIF CLAVICULAR FRACTURE Left 07/05/2015   Procedure: OPEN REDUCTION INTERNAL FIXATION (ORIF) CLAVICULAR FRACTURE;  Surgeon: Marybelle Killings, MD;  Location: [place];  Service: Orthopedics;  Laterality:  Left;   PROSTATECTOMY  9 years ago    FAMILY HISTORY: The patient family history includes Heart disease in his mother; Stroke in his father.  SOCIAL HISTORY:  The patient  reports that he quit smoking about 16 years ago. His smoking use included cigarettes. He has a 25.00 pack-year smoking history. He has never used smokeless tobacco. He reports that he does not drink alcohol and does not use drugs.  REVIEW OF SYSTEMS: Review of Systems  Constitutional: Negative for chills and fever.  HENT:  Negative for hoarse  voice and nosebleeds.   Eyes:  Negative for discharge, double vision and pain.  Cardiovascular:  Negative for chest pain, claudication, dyspnea on exertion, leg swelling, near-syncope, orthopnea, palpitations, paroxysmal nocturnal dyspnea and syncope.  Respiratory:  Negative for hemoptysis and shortness of breath.   Musculoskeletal:  Negative for muscle cramps and myalgias.  Gastrointestinal:  Negative for abdominal pain, constipation, diarrhea, hematemesis, hematochezia, melena, nausea and vomiting.  Neurological:  Negative for dizziness and light-headedness.   PHYSICAL EXAM: Vitals with BMI 12/22/2020 11/03/2020 07/06/2015  Height 6' 1"  6' 1"  -  Weight 211 lbs 209 lbs -  BMI 65.99 35.70 -  Systolic 177 939 030  Diastolic 77 82 67  Pulse 78 71 48    CONSTITUTIONAL: Well-developed and well-nourished. No acute distress.  SKIN: Skin is warm and dry. No rash noted. No cyanosis. No pallor. No jaundice HEAD: Normocephalic and atraumatic.  EYES: No scleral icterus MOUTH/THROAT: Moist oral membranes.  NECK: No JVD present. No thyromegaly noted. No carotid bruits  LYMPHATIC: No visible cervical adenopathy.  CHEST Normal respiratory effort. No intercostal retractions  LUNGS: Clear to auscultation bilaterally. No stridor. No wheezes. No rales.  CARDIOVASCULAR: Regular rate and rhythm, positive S1-S2, no murmurs rubs or gallops appreciated. ABDOMINAL: No apparent ascites.  EXTREMITIES: No peripheral edema  HEMATOLOGIC: No significant bruising NEUROLOGIC: Oriented to person, place, and time. Nonfocal. Normal muscle tone.  PSYCHIATRIC: Normal mood and affect. Normal behavior. Cooperative  CARDIAC DATABASE: EKG: 11/03/2020: Sinus  Rhythm, 67bpm, normal axis, without underling injury pattern, TWI anteroseptal leads, no significant change compared to prior dated 07/05/2015.   Echocardiogram: 0/31/2022: Left ventricle cavity is normal in size. Mild concentric hypertrophy of the left ventricle.  Normal global wall motion. Normal LV systolic function with EF 61%. Normal diastolic filling pattern. Structurally normal trileaflet aortic valve. Mild to moderate aortic regurgitation. Mild (Grade I) mitral regurgitation. Mild tricuspid regurgitation. Estimated pulmonary artery systolic pressure 31 mmHg   Stress Testing: Lexiscan/modified Bruce Sestamibi stress test 11/13/2020: Lexiscan/Bruce nuclear stress test performed using 1-day protocol. Patient reached 7.0 METS and 79% MPHR.  In addition, stress EKG at 79% did not show significant ischemic changes. LV cavity is dilated (EDV 178 ml). Normal myocardial perfusion. Stress LVEF 52%. Low risk study.  Heart Catheterization: None  LABORATORY DATA: CBC Latest Ref Rng & Units 07/05/2015 07/05/2008 06/28/2008  WBC 4.0 - 10.5 K/uL 6.1 - 5.9  Hemoglobin 13.0 - 17.0 g/dL 12.2(L) 12.1(L) 13.9  Hematocrit 39.0 - 52.0 % 39.0 36.3(L) 40.5  Platelets 150 - 400 K/uL 287 - 336    CMP Latest Ref Rng & Units 12/11/2020 07/05/2015 06/28/2008  Glucose 70 - 99 mg/dL 113(H) 86 106(H)  BUN 6 - 24 mg/dL 11 17 12   Creatinine 0.76 - 1.27 mg/dL 1.29(H) 0.95 0.89  Sodium 134 - 144 mmol/L 140 137 137  Potassium 3.5 - 5.2 mmol/L 4.7 4.7 4.2  Chloride 96 - 106 mmol/L 105 105 105  CO2  20 - 29 mmol/L 22 24 27   Calcium 8.7 - 10.2 mg/dL 9.7 9.4 9.3  Total Protein 6.0 - 8.5 g/dL 6.9 6.4(L) -  Total Bilirubin 0.0 - 1.2 mg/dL 0.4 0.9 -  Alkaline Phos 44 - 121 IU/L 65 88 -  AST 0 - 40 IU/L 28 35 -  ALT 0 - 44 IU/L 19 37 -    Lipid Panel     Component Value Date/Time   CHOL 171 12/11/2020 0759   TRIG 60 12/11/2020 0759   HDL 54 12/11/2020 0759   LDLCALC 105 (H) 12/11/2020 0759   LDLDIRECT 110 (H) 12/11/2020 0800   LABVLDL 12 12/11/2020 0759    No components found for: NTPROBNP No results for input(s): PROBNP in the last 8760 hours. No results for input(s): TSH in the last 8760 hours.  BMP Recent Labs    12/11/20 0758  NA 140  K 4.7  CL 105  CO2 22   GLUCOSE 113*  BUN 11  CREATININE 1.29*  CALCIUM 9.7    HEMOGLOBIN A1C No results found for: HGBA1C, MPG  09/14/2020: Hemoglobin 14.2 g/dL, hematocrit 42.9% TSH 0.96 Sodium 141, potassium 4.6, chloride 104, bicarb 25, BUN 19, creatinine 1.29 AST 24, ALT 24, alkaline phosphatase 63 Total cholesterol 232, triglycerides 62, HDL 54, LDL 166, non-HDL 178  12/11/2020: Total cholesterol 171, HDL 54, triglycerides 60, direct LDL 110, non-HDL 105.  IMPRESSION:    ICD-10-CM   1. Pure hypercholesterolemia  E78.00 rosuvastatin (CRESTOR) 20 MG tablet    Lipid Panel With LDL/HDL Ratio    LDL cholesterol, direct    CMP14+EGFR    2. Agatston coronary artery calcium score between 100 and 199  R93.1 rosuvastatin (CRESTOR) 20 MG tablet    3. Coronary atherosclerosis due to calcified coronary lesion  I25.10 rosuvastatin (CRESTOR) 20 MG tablet   I25.84     4. Former smoker  Z87.891        RECOMMENDATIONS: Dustin Conrad is a 55 y.o. male whose past medical history and cardiac risk factors include: hx of prostate cancer, HLD, moderate CAC  (114AU, 79th percentile), Bipolar disorder.   Agatston coronary artery calcium score between 100 and 199 Moderate coronary artery calcification, total CAC 114 AU, 79th percentile Continue aspirin and statin therapy. Patient's direct LDL remains elevated, shared decision was to increase Crestor to 20 mg p.o. nightly new prescription provided Repeat fasting lipid profile and CMP in 6 weeks to reevaluate his lipids and liver function. Echo and stress test results reviewed with the patient during today's office visit.   No additional cardiovascular testing warranted at this time.   Educated on the importance of improving his modifiable cardiovascular risk factors.  Coronary atherosclerosis due to calcified coronary lesion See above  Pure hypercholesterolemia Uptitrate Crestor to 20 mg p.o. nightly. Repeat labs in 6 weeks to reevaluate therapy Close  follow-up  Former smoker Educated on the importance of continued smoking cessation.  FINAL MEDICATION LIST END OF ENCOUNTER: Meds ordered this encounter  Medications   rosuvastatin (CRESTOR) 20 MG tablet    Sig: Take 1 tablet (20 mg total) by mouth at bedtime.    Dispense:  90 tablet    Refill:  0    Medications Discontinued During This Encounter  Medication Reason   rosuvastatin (CRESTOR) 10 MG tablet Reorder     Current Outpatient Medications:    ARIPiprazole (ABILIFY) 10 MG tablet, Take 10 mg by mouth every morning., Disp: , Rfl:    aspirin EC 81 MG  tablet, Take 1 tablet (81 mg total) by mouth daily. Swallow whole., Disp: 30 tablet, Rfl: 11   cetirizine (ZYRTEC) 10 MG tablet, Take 10 mg by mouth daily., Disp: , Rfl:    cholecalciferol (VITAMIN D) 400 units TABS tablet, Take 400 Units by mouth daily., Disp: , Rfl:    fluticasone (FLONASE) 50 MCG/ACT nasal spray, Place 1 spray into both nostrils daily., Disp: , Rfl:    Glucosamine-Chondroit-Vit C-Mn (GLUCOSAMINE 1500 COMPLEX PO), Take by mouth., Disp: , Rfl:    lithium carbonate 300 MG capsule, Take 300 mg by mouth 2 (two) times daily., Disp: , Rfl:    Loratadine 10 MG CAPS, Take 1 capsule by mouth daily at 12 noon., Disp: , Rfl:    meloxicam (MOBIC) 15 MG tablet, Take 15 mg by mouth daily., Disp: , Rfl:    pantoprazole (PROTONIX) 40 MG tablet, Take 40 mg by mouth daily., Disp: , Rfl:    sildenafil (REVATIO) 20 MG tablet, TAKE 2 TO 5 TABLETS BY MOUTH ONCE A DAY AS NEEDED, Disp: , Rfl:    tamsulosin (FLOMAX) 0.4 MG CAPS capsule, Take 0.4 mg by mouth daily., Disp: , Rfl:    zolpidem (AMBIEN CR) 12.5 MG CR tablet, Take 1 tablet by mouth at bedtime as needed., Disp: , Rfl:    rosuvastatin (CRESTOR) 20 MG tablet, Take 1 tablet (20 mg total) by mouth at bedtime., Disp: 90 tablet, Rfl: 0  Orders Placed This Encounter  Procedures   Lipid Panel With LDL/HDL Ratio   LDL cholesterol, direct   CMP14+EGFR    There are no Patient  Instructions on file for this visit.   --Continue cardiac medications as reconciled in final medication list. --Return in about 10 months (around 10/29/2021) for Follow up, Coronary artery calcification, Lipid. Or sooner if needed. --Continue follow-up with your primary care physician regarding the management of your other chronic comorbid conditions.  Patient's questions and concerns were addressed to his satisfaction. He voices understanding of the instructions provided during this encounter.   This note was created using a voice recognition software as a result there may be grammatical errors inadvertently enclosed that do not reflect the nature of this encounter. Every attempt is made to correct such errors.  Rex Kras, [place], [facility]  Pager: [phone] Office: [phone]

## 2021-01-16 ENCOUNTER — Other Ambulatory Visit: Payer: Self-pay

## 2021-01-16 DIAGNOSIS — E7800 Pure hypercholesterolemia, unspecified: Secondary | ICD-10-CM

## 2021-02-07 ENCOUNTER — Other Ambulatory Visit: Payer: Self-pay | Admitting: Cardiology

## 2021-02-08 LAB — CMP14+EGFR
ALT: 30 IU/L (ref 0–44)
AST: 39 IU/L (ref 0–40)
Albumin/Globulin Ratio: 2.1 (ref 1.2–2.2)
Albumin: 4.9 g/dL (ref 3.8–4.9)
Alkaline Phosphatase: 72 IU/L (ref 44–121)
BUN/Creatinine Ratio: 15 (ref 9–20)
BUN: 19 mg/dL (ref 6–24)
Bilirubin Total: 0.4 mg/dL (ref 0.0–1.2)
CO2: 22 mmol/L (ref 20–29)
Calcium: 9.9 mg/dL (ref 8.7–10.2)
Chloride: 104 mmol/L (ref 96–106)
Creatinine, Ser: 1.27 mg/dL (ref 0.76–1.27)
Globulin, Total: 2.3 g/dL (ref 1.5–4.5)
Glucose: 117 mg/dL — ABNORMAL HIGH (ref 70–99)
Potassium: 4.9 mmol/L (ref 3.5–5.2)
Sodium: 141 mmol/L (ref 134–144)
Total Protein: 7.2 g/dL (ref 6.0–8.5)
eGFR: 66 mL/min/{1.73_m2} (ref >59)

## 2021-02-08 LAB — LDL CHOLESTEROL, DIRECT: LDL Direct: 103 mg/dL — ABNORMAL HIGH (ref 0–99)

## 2021-02-08 LAB — LIPID PANEL WITH LDL/HDL RATIO
Cholesterol, Total: 180 mg/dL (ref 100–199)
HDL: 51 mg/dL (ref >39)
LDL Chol Calc (NIH): 109 mg/dL — ABNORMAL HIGH (ref 0–99)
LDL/HDL Ratio: 2.1 ratio (ref 0.0–3.6)
Triglycerides: 109 mg/dL (ref 0–149)
VLDL Cholesterol Cal: 20 mg/dL (ref 5–40)

## 2021-02-09 NOTE — Progress Notes (Signed)
No answer left a vm to call back

## 2021-02-12 NOTE — Progress Notes (Signed)
Patient is aware of results and he said he is taking Crestor 20mg  nightly

## 2021-02-20 NOTE — Progress Notes (Signed)
Tried calling patient no answer left a vm

## 2021-02-21 NOTE — Progress Notes (Signed)
Patient is aware of results and labs will be ordered

## 2021-03-23 ENCOUNTER — Other Ambulatory Visit: Payer: Self-pay | Admitting: Cardiology

## 2021-03-23 DIAGNOSIS — R931 Abnormal findings on diagnostic imaging of heart and coronary circulation: Secondary | ICD-10-CM

## 2021-03-23 DIAGNOSIS — I2510 Atherosclerotic heart disease of native coronary artery without angina pectoris: Secondary | ICD-10-CM

## 2021-03-23 DIAGNOSIS — I2584 Coronary atherosclerosis due to calcified coronary lesion: Secondary | ICD-10-CM

## 2021-03-23 DIAGNOSIS — E7800 Pure hypercholesterolemia, unspecified: Secondary | ICD-10-CM

## 2021-06-14 ENCOUNTER — Other Ambulatory Visit: Payer: Self-pay | Admitting: Cardiology

## 2021-06-14 DIAGNOSIS — E7800 Pure hypercholesterolemia, unspecified: Secondary | ICD-10-CM

## 2021-06-14 DIAGNOSIS — I2510 Atherosclerotic heart disease of native coronary artery without angina pectoris: Secondary | ICD-10-CM

## 2021-06-14 DIAGNOSIS — R931 Abnormal findings on diagnostic imaging of heart and coronary circulation: Secondary | ICD-10-CM

## 2021-10-10 ENCOUNTER — Other Ambulatory Visit: Payer: Self-pay | Admitting: Cardiology

## 2021-10-10 DIAGNOSIS — R931 Abnormal findings on diagnostic imaging of heart and coronary circulation: Secondary | ICD-10-CM

## 2021-10-10 DIAGNOSIS — E7800 Pure hypercholesterolemia, unspecified: Secondary | ICD-10-CM

## 2021-10-10 DIAGNOSIS — I2510 Atherosclerotic heart disease of native coronary artery without angina pectoris: Secondary | ICD-10-CM

## 2021-10-11 ENCOUNTER — Other Ambulatory Visit: Payer: Self-pay

## 2021-10-11 ENCOUNTER — Other Ambulatory Visit: Payer: Self-pay | Admitting: Cardiology

## 2021-10-11 DIAGNOSIS — I2510 Atherosclerotic heart disease of native coronary artery without angina pectoris: Secondary | ICD-10-CM

## 2021-10-11 DIAGNOSIS — E7800 Pure hypercholesterolemia, unspecified: Secondary | ICD-10-CM

## 2021-10-11 DIAGNOSIS — R931 Abnormal findings on diagnostic imaging of heart and coronary circulation: Secondary | ICD-10-CM

## 2021-10-11 MED ORDER — ROSUVASTATIN CALCIUM 20 MG PO TABS: ORAL_TABLET | ORAL | 0 refills | Status: DC

## 2021-10-15 ENCOUNTER — Other Ambulatory Visit: Payer: Self-pay | Admitting: Internal Medicine

## 2021-10-15 ENCOUNTER — Other Ambulatory Visit: Payer: Self-pay

## 2021-10-15 DIAGNOSIS — E7800 Pure hypercholesterolemia, unspecified: Secondary | ICD-10-CM

## 2021-10-15 DIAGNOSIS — R911 Solitary pulmonary nodule: Secondary | ICD-10-CM

## 2021-10-15 DIAGNOSIS — I2510 Atherosclerotic heart disease of native coronary artery without angina pectoris: Secondary | ICD-10-CM

## 2021-10-17 LAB — CMP14+EGFR
ALT: 22 IU/L (ref 0–44)
AST: 30 IU/L (ref 0–40)
Albumin/Globulin Ratio: 2.1 (ref 1.2–2.2)
Albumin: 4.7 g/dL (ref 3.8–4.9)
Alkaline Phosphatase: 75 IU/L (ref 44–121)
BUN/Creatinine Ratio: 13 (ref 9–20)
BUN: 18 mg/dL (ref 6–24)
Bilirubin Total: 0.6 mg/dL (ref 0.0–1.2)
CO2: 23 mmol/L (ref 20–29)
Calcium: 9.8 mg/dL (ref 8.7–10.2)
Chloride: 100 mmol/L (ref 96–106)
Creatinine, Ser: 1.43 mg/dL — ABNORMAL HIGH (ref 0.76–1.27)
Globulin, Total: 2.2 g/dL (ref 1.5–4.5)
Glucose: 105 mg/dL — ABNORMAL HIGH (ref 70–99)
Potassium: 4.4 mmol/L (ref 3.5–5.2)
Sodium: 141 mmol/L (ref 134–144)
Total Protein: 6.9 g/dL (ref 6.0–8.5)
eGFR: 57 mL/min/{1.73_m2} — ABNORMAL LOW (ref >59)

## 2021-10-17 LAB — LIPID PANEL
Chol/HDL Ratio: 2.3 ratio (ref 0.0–5.0)
Cholesterol, Total: 152 mg/dL (ref 100–199)
HDL: 65 mg/dL (ref >39)
LDL Chol Calc (NIH): 64 mg/dL (ref 0–99)
Triglycerides: 135 mg/dL (ref 0–149)
VLDL Cholesterol Cal: 23 mg/dL (ref 5–40)

## 2021-10-17 LAB — LDL CHOLESTEROL, DIRECT: LDL Direct: 69 mg/dL (ref 0–99)

## 2021-10-22 ENCOUNTER — Ambulatory Visit: Payer: BC Managed Care – PPO | Admitting: Cardiology

## 2021-10-22 ENCOUNTER — Encounter: Payer: Self-pay | Admitting: Cardiology

## 2021-10-22 VITALS — BP 126/82 | HR 75 | Temp 98.1°F | Resp 16 | Ht 73.0 in | Wt 217.4 lb

## 2021-10-22 DIAGNOSIS — Z87891 Personal history of nicotine dependence: Secondary | ICD-10-CM

## 2021-10-22 DIAGNOSIS — I2510 Atherosclerotic heart disease of native coronary artery without angina pectoris: Secondary | ICD-10-CM

## 2021-10-22 DIAGNOSIS — R931 Abnormal findings on diagnostic imaging of heart and coronary circulation: Secondary | ICD-10-CM

## 2021-10-22 DIAGNOSIS — E7800 Pure hypercholesterolemia, unspecified: Secondary | ICD-10-CM

## 2021-10-22 NOTE — Progress Notes (Signed)
ID:  Dustin Conrad, DOB 03-19-1965, MRN [id number]  PCP:  Deland Pretty, MD  Cardiologist:  Rex Kras, DO, [facility] (established care 11/03/20)  Date: 10/22/21 Last Office Visit: 12/22/2021  Chief Complaint  Patient presents with   Coronary artery calcification   Hyperlipidemia   Follow-up    HPI  Dustin Conrad is a 56 y.o. male whose past medical history and cardiovascular risk factors include: hx of prostate cancer, HLD, moderate CAC  (114AU, 79th percentile), Bipolar disorder.   Referred to the practice for evaluation and management of coronary artery calcification.  He also carries a long history of hyperlipidemia which she was managing with lifestyle changes.  However due to his CAC of 114 AU, placing him at the 79th percentile the shared decision was to start medical therapy for lipid management.  His most recent lipid profile reviewed LDL is within acceptable limits.  Clinically denies anginal discomfort or heart failure symptoms.  His overall physical activity remains relatively stable.  No hospitalizations or urgent care visits for cardiovascular symptoms since last office visit.  FUNCTIONAL STATUS: Yoga everyday for 35 minutes.    ALLERGIES: No Known Allergies  MEDICATION LIST PRIOR TO VISIT: Current Meds  Medication Sig   ARIPiprazole (ABILIFY) 10 MG tablet Take 10 mg by mouth every morning.   aspirin EC 81 MG tablet Take 1 tablet (81 mg total) by mouth daily. Swallow whole.   cetirizine (ZYRTEC) 10 MG tablet Take 10 mg by mouth daily.   cholecalciferol (VITAMIN D) 400 units TABS tablet Take 400 Units by mouth daily.   fluticasone (FLONASE) 50 MCG/ACT nasal spray Place 1 spray into both nostrils daily.   Glucosamine-Chondroit-Vit C-Mn (GLUCOSAMINE 1500 COMPLEX PO) Take by mouth.   lithium carbonate 300 MG capsule Take 300 mg by mouth 2 (two) times daily.   Loratadine 10 MG CAPS Take 1 capsule by mouth daily at 12 noon.   meloxicam (MOBIC) 15 MG tablet Take 15 mg by  mouth daily.   pantoprazole (PROTONIX) 40 MG tablet Take 40 mg by mouth daily.   rosuvastatin (CRESTOR) 20 MG tablet TAKE 1 TABLET(20 MG) BY MOUTH AT BEDTIME   sildenafil (REVATIO) 20 MG tablet TAKE 2 TO 5 TABLETS BY MOUTH ONCE A DAY AS NEEDED   tamsulosin (FLOMAX) 0.4 MG CAPS capsule Take 0.4 mg by mouth daily.   zolpidem (AMBIEN CR) 12.5 MG CR tablet Take 1 tablet by mouth at bedtime as needed.     PAST MEDICAL HISTORY: Past Medical History:  Diagnosis Date   Arthritis    Bipolar 1 disorder ([place])    Cancer ([place])    prostate   Coronary atherosclerosis due to calcified coronary lesion    GERD (gastroesophageal reflux disease)    Hyperlipidemia     PAST SURGICAL HISTORY: Past Surgical History:  Procedure Laterality Date   COLONOSCOPY W/ BIOPSIES     KNEE CARTILAGE SURGERY Left 11 years ago   ORIF CLAVICULAR FRACTURE Left 07/05/2015   Procedure: OPEN REDUCTION INTERNAL FIXATION (ORIF) CLAVICULAR FRACTURE;  Surgeon: Marybelle Killings, MD;  Location: [place];  Service: Orthopedics;  Laterality: Left;   PROSTATECTOMY  9 years ago    FAMILY HISTORY: The patient family history includes Heart disease in his mother; Stroke in his father.  SOCIAL HISTORY:  The patient  reports that he quit smoking about 17 years ago. His smoking use included cigarettes. He has a 25.00 pack-year smoking history. He has never used smokeless tobacco. He reports that he does not  drink alcohol and does not use drugs.  REVIEW OF SYSTEMS: Review of Systems  Constitutional: Negative for chills and fever.  HENT:  Negative for hoarse voice and nosebleeds.   Eyes:  Negative for discharge, double vision and pain.  Cardiovascular:  Negative for chest pain, claudication, dyspnea on exertion, leg swelling, near-syncope, orthopnea, palpitations, paroxysmal nocturnal dyspnea and syncope.  Respiratory:  Negative for hemoptysis and shortness of breath.   Musculoskeletal:  Negative for muscle cramps and myalgias.   Gastrointestinal:  Negative for abdominal pain, constipation, diarrhea, hematemesis, hematochezia, melena, nausea and vomiting.  Neurological:  Negative for dizziness and light-headedness.    PHYSICAL EXAM:    10/22/2021   10:26 AM 12/22/2020    1:53 PM 11/03/2020    9:29 AM  Vitals with BMI  Height '6\' 1"'$  '6\' 1"'$  '6\' 1"'$   Weight 217 lbs 6 oz 211 lbs 209 lbs  BMI 28.69 10.93 23.55  Systolic 732 202 542  Diastolic 82 77 82  Pulse 75 78 71   Physical Exam  Constitutional: No distress.  Age appropriate, hemodynamically stable.   Neck: No JVD present.  Cardiovascular: Normal rate, regular rhythm, S1 normal, S2 normal, intact distal pulses and normal pulses. Exam reveals no gallop, no S3 and no S4.  No murmur heard. Pulmonary/Chest: Effort normal and breath sounds normal. No stridor. He has no wheezes. He has no rales.  Abdominal: Soft. Bowel sounds are normal. He exhibits no distension. There is no abdominal tenderness.  Musculoskeletal:        General: No edema.     Cervical back: Neck supple.  Neurological: He is alert and oriented to person, place, and time. He has intact cranial nerves (2-12).  Skin: Skin is warm and moist.   CARDIAC DATABASE: EKG: 10/22/2021: Sinus rhythm, 73 bpm, poor R wave progression, without underlying injury pattern.  Echocardiogram: 11/13/2020: Left ventricle cavity is normal in size. Mild concentric hypertrophy of the left ventricle. Normal global wall motion. Normal LV systolic function with EF 61%. Normal diastolic filling pattern. Structurally normal trileaflet aortic valve. Mild to moderate aortic regurgitation. Mild (Grade I) mitral regurgitation. Mild tricuspid regurgitation. Estimated pulmonary artery systolic pressure 31 mmHg   Stress Testing: Lexiscan/modified Bruce Sestamibi stress test 11/13/2020: Lexiscan/Bruce nuclear stress test performed using 1-day protocol. Patient reached 7.0 METS and 79% MPHR.  In addition, stress EKG at 79% did not  show significant ischemic changes. LV cavity is dilated (EDV 178 ml). Normal myocardial perfusion. Stress LVEF 52%. Low risk study.  Heart Catheterization: None  LABORATORY DATA:    Latest Ref Rng & Units 07/05/2015    2:05 PM 07/05/2008    5:10 AM 06/28/2008    8:25 AM  CBC  WBC 4.0 - 10.5 K/uL 6.1   5.9   Hemoglobin 13.0 - 17.0 g/dL 12.2  12.1  13.9   Hematocrit 39.0 - 52.0 % 39.0  36.3  40.5   Platelets 150 - 400 K/uL 287   336        Latest Ref Rng & Units 10/16/2021    7:42 AM 02/07/2021    8:27 AM 12/11/2020    7:58 AM  CMP  Glucose 70 - 99 mg/dL 105  117  113   BUN 6 - 24 mg/dL '18  19  11   '$ Creatinine 0.76 - 1.27 mg/dL 1.43  1.27  1.29   Sodium 134 - 144 mmol/L 141  141  140   Potassium 3.5 - 5.2 mmol/L 4.4  4.9  4.7  Chloride 96 - 106 mmol/L 100  104  105   CO2 20 - 29 mmol/L '23  22  22   '$ Calcium 8.7 - 10.2 mg/dL 9.8  9.9  9.7   Total Protein 6.0 - 8.5 g/dL 6.9  7.2  6.9   Total Bilirubin 0.0 - 1.2 mg/dL 0.6  0.4  0.4   Alkaline Phos 44 - 121 IU/L 75  72  65   AST 0 - 40 IU/L 30  39  28   ALT 0 - 44 IU/L '22  30  19     '$ Lipid Panel     Component Value Date/Time   CHOL 152 10/16/2021 0742   TRIG 135 10/16/2021 0742   HDL 65 10/16/2021 0742   CHOLHDL 2.3 10/16/2021 0742   LDLCALC 64 10/16/2021 0742   LDLDIRECT 69 10/16/2021 0743   LABVLDL 23 10/16/2021 0742    No components found for: "NTPROBNP" No results for input(s): "PROBNP" in the last 8760 hours. No results for input(s): "TSH" in the last 8760 hours.  BMP Recent Labs    12/11/20 0758 02/07/21 0827 10/16/21 0742  NA 140 141 141  K 4.7 4.9 4.4  CL 105 104 100  CO2 '22 22 23  '$ GLUCOSE 113* 117* 105*  BUN '11 19 18  '$ CREATININE 1.29* 1.27 1.43*  CALCIUM 9.7 9.9 9.8    HEMOGLOBIN A1C No results found for: "HGBA1C", "MPG"  09/14/2020: Hemoglobin 14.2 g/dL, hematocrit 42.9% TSH 0.96 Sodium 141, potassium 4.6, chloride 104, bicarb 25, BUN 19, creatinine 1.29 AST 24, ALT 24, alkaline  phosphatase 63 Total cholesterol 232, triglycerides 62, HDL 54, LDL 166, non-HDL 178  12/11/2020: Total cholesterol 171, HDL 54, triglycerides 60, direct LDL 110, non-HDL 105.  External Labs: Collected: 09/19/2021 provided by referring physician. A1c 5.4. Total cholesterol 196, triglycerides 169, HDL 69, LDL 93, non-HDL 127 Hemoglobin 15.4 g/dL, hematocrit 45.9% BUN 24, creatinine 1.29. Sodium 136, potassium 4.6, chloride 23, bicarb 23 AST 40, ALT 59, alkaline phosphatase 88 TSH 0.96  IMPRESSION:    ICD-10-CM   1. Coronary atherosclerosis due to calcified coronary lesion  I25.10 EKG 12-Lead   I25.84     2. Agatston coronary artery calcium score between 100 and 199  R93.1     3. Pure hypercholesterolemia  E78.00     4. Former smoker  Z87.891        RECOMMENDATIONS: Dustin Conrad is a 56 y.o. male whose past medical history and cardiac risk factors include: hx of prostate cancer, HLD, moderate CAC  (114AU, 79th percentile), Bipolar disorder.   Referred to the practice for management of coronary artery calcification.  Total CAC 114, placing him at the 79th percentile.  Since establishing care he has undergone echo and MPI in October 2022 results reviewed as part of today's office visit.  His cholesterol medications were uptitrated to 20 mg p.o. daily of Crestor and his repeat blood work today notes significant improvement in LDL when compared to prior levels.  He does not endorse any myalgias.  Outside labs provided by PCP independently reviewed and noted above.  Recent lab notes mild renal insufficiency.  He is asked to increase water intake by 2 to 3 glasses of water per day.  EKG today is nonischemic.  Overall functional capacity remained stable.  No additional cardiovascular work-up warranted at this time.  Would like to see him back on an annual basis.  FINAL MEDICATION LIST END OF ENCOUNTER: No orders of the defined types were placed  in this encounter.   Medications  Discontinued During This Encounter  Medication Reason   rosuvastatin (CRESTOR) 20 MG tablet Duplicate     Current Outpatient Medications:    ARIPiprazole (ABILIFY) 10 MG tablet, Take 10 mg by mouth every morning., Disp: , Rfl:    aspirin EC 81 MG tablet, Take 1 tablet (81 mg total) by mouth daily. Swallow whole., Disp: 30 tablet, Rfl: 11   cetirizine (ZYRTEC) 10 MG tablet, Take 10 mg by mouth daily., Disp: , Rfl:    cholecalciferol (VITAMIN D) 400 units TABS tablet, Take 400 Units by mouth daily., Disp: , Rfl:    fluticasone (FLONASE) 50 MCG/ACT nasal spray, Place 1 spray into both nostrils daily., Disp: , Rfl:    Glucosamine-Chondroit-Vit C-Mn (GLUCOSAMINE 1500 COMPLEX PO), Take by mouth., Disp: , Rfl:    lithium carbonate 300 MG capsule, Take 300 mg by mouth 2 (two) times daily., Disp: , Rfl:    Loratadine 10 MG CAPS, Take 1 capsule by mouth daily at 12 noon., Disp: , Rfl:    meloxicam (MOBIC) 15 MG tablet, Take 15 mg by mouth daily., Disp: , Rfl:    pantoprazole (PROTONIX) 40 MG tablet, Take 40 mg by mouth daily., Disp: , Rfl:    rosuvastatin (CRESTOR) 20 MG tablet, TAKE 1 TABLET(20 MG) BY MOUTH AT BEDTIME, Disp: 90 tablet, Rfl: 0   sildenafil (REVATIO) 20 MG tablet, TAKE 2 TO 5 TABLETS BY MOUTH ONCE A DAY AS NEEDED, Disp: , Rfl:    tamsulosin (FLOMAX) 0.4 MG CAPS capsule, Take 0.4 mg by mouth daily., Disp: , Rfl:    zolpidem (AMBIEN CR) 12.5 MG CR tablet, Take 1 tablet by mouth at bedtime as needed., Disp: , Rfl:   Orders Placed This Encounter  Procedures   EKG 12-Lead    There are no Patient Instructions on file for this visit.   --Continue cardiac medications as reconciled in final medication list. --Return in about 1 year (around 10/23/2022) for Annual follow up visit, Lipid, Coronary artery calcification. Or sooner if needed. --Continue follow-up with your primary care physician regarding the management of your other chronic comorbid conditions.  Patient's questions and  concerns were addressed to his satisfaction. He voices understanding of the instructions provided during this encounter.   This note was created using a voice recognition software as a result there may be grammatical errors inadvertently enclosed that do not reflect the nature of this encounter. Every attempt is made to correct such errors.  Rex Kras, [place], [facility]  Pager: [phone] Office: [phone]

## 2021-11-09 ENCOUNTER — Other Ambulatory Visit: Payer: Self-pay

## 2021-11-10 ENCOUNTER — Other Ambulatory Visit: Payer: Self-pay | Admitting: Cardiology

## 2021-11-10 DIAGNOSIS — R931 Abnormal findings on diagnostic imaging of heart and coronary circulation: Secondary | ICD-10-CM

## 2021-11-10 DIAGNOSIS — I2510 Atherosclerotic heart disease of native coronary artery without angina pectoris: Secondary | ICD-10-CM

## 2021-11-21 ENCOUNTER — Other Ambulatory Visit: Payer: Self-pay

## 2021-12-29 ENCOUNTER — Other Ambulatory Visit: Payer: Self-pay | Admitting: Cardiology

## 2021-12-29 DIAGNOSIS — E7800 Pure hypercholesterolemia, unspecified: Secondary | ICD-10-CM

## 2021-12-29 DIAGNOSIS — R931 Abnormal findings on diagnostic imaging of heart and coronary circulation: Secondary | ICD-10-CM

## 2021-12-29 DIAGNOSIS — I2510 Atherosclerotic heart disease of native coronary artery without angina pectoris: Secondary | ICD-10-CM

## 2021-12-31 ENCOUNTER — Other Ambulatory Visit: Payer: Self-pay | Admitting: Cardiology

## 2021-12-31 DIAGNOSIS — E7800 Pure hypercholesterolemia, unspecified: Secondary | ICD-10-CM

## 2021-12-31 DIAGNOSIS — I2510 Atherosclerotic heart disease of native coronary artery without angina pectoris: Secondary | ICD-10-CM

## 2021-12-31 DIAGNOSIS — R931 Abnormal findings on diagnostic imaging of heart and coronary circulation: Secondary | ICD-10-CM

## 2022-08-31 ENCOUNTER — Other Ambulatory Visit: Payer: Self-pay | Admitting: Cardiology

## 2022-08-31 DIAGNOSIS — I2510 Atherosclerotic heart disease of native coronary artery without angina pectoris: Secondary | ICD-10-CM

## 2022-08-31 DIAGNOSIS — R931 Abnormal findings on diagnostic imaging of heart and coronary circulation: Secondary | ICD-10-CM

## 2022-08-31 DIAGNOSIS — E7800 Pure hypercholesterolemia, unspecified: Secondary | ICD-10-CM

## 2022-10-03 LAB — LAB REPORT - SCANNED
A1c: 5.6
EGFR: 69

## 2022-10-18 DIAGNOSIS — Z79899 Other long term (current) drug therapy: Secondary | ICD-10-CM

## 2022-10-18 DIAGNOSIS — I2510 Atherosclerotic heart disease of native coronary artery without angina pectoris: Secondary | ICD-10-CM

## 2022-10-18 DIAGNOSIS — E7800 Pure hypercholesterolemia, unspecified: Secondary | ICD-10-CM

## 2022-10-22 NOTE — Telephone Encounter (Signed)
Please check fasting lipid profile, direct LDL, CMP prior to next office visit.  Dustin Newmann Hodge, DO, [facility]

## 2022-10-25 ENCOUNTER — Ambulatory Visit: Payer: BC Managed Care – PPO | Attending: Cardiology

## 2022-10-25 DIAGNOSIS — Z79899 Other long term (current) drug therapy: Secondary | ICD-10-CM

## 2022-10-25 DIAGNOSIS — I2510 Atherosclerotic heart disease of native coronary artery without angina pectoris: Secondary | ICD-10-CM

## 2022-10-25 DIAGNOSIS — E7800 Pure hypercholesterolemia, unspecified: Secondary | ICD-10-CM

## 2022-10-25 LAB — LIPID PANEL
Chol/HDL Ratio: 4.1 {ratio} (ref 0.0–5.0)
Cholesterol, Total: 180 mg/dL (ref 100–199)
HDL: 44 mg/dL (ref >39)
LDL Chol Calc (NIH): 113 mg/dL — ABNORMAL HIGH (ref 0–99)
Triglycerides: 130 mg/dL (ref 0–149)
VLDL Cholesterol Cal: 23 mg/dL (ref 5–40)

## 2022-10-25 LAB — COMPREHENSIVE METABOLIC PANEL
ALT: 20 [IU]/L (ref 0–44)
AST: 27 [IU]/L (ref 0–40)
Albumin: 4.6 g/dL (ref 3.8–4.9)
Alkaline Phosphatase: 70 [IU]/L (ref 44–121)
BUN/Creatinine Ratio: 13 (ref 9–20)
BUN: 16 mg/dL (ref 6–24)
Bilirubin Total: 0.6 mg/dL (ref 0.0–1.2)
CO2: 22 mmol/L (ref 20–29)
Calcium: 9.5 mg/dL (ref 8.7–10.2)
Chloride: 102 mmol/L (ref 96–106)
Creatinine, Ser: 1.19 mg/dL (ref 0.76–1.27)
Globulin, Total: 2.2 g/dL (ref 1.5–4.5)
Glucose: 110 mg/dL — ABNORMAL HIGH (ref 70–99)
Potassium: 4.7 mmol/L (ref 3.5–5.2)
Sodium: 136 mmol/L (ref 134–144)
Total Protein: 6.8 g/dL (ref 6.0–8.5)
eGFR: 71 mL/min/{1.73_m2} (ref >59)

## 2022-10-25 LAB — LDL CHOLESTEROL, DIRECT: LDL Direct: 110 mg/dL — ABNORMAL HIGH (ref 0–99)

## 2022-10-28 ENCOUNTER — Ambulatory Visit: Payer: BC Managed Care – PPO | Attending: Cardiology | Admitting: Cardiology

## 2022-10-28 VITALS — BP 136/82 | HR 87 | Resp 16 | Ht 73.0 in | Wt 231.4 lb

## 2022-10-28 DIAGNOSIS — I2510 Atherosclerotic heart disease of native coronary artery without angina pectoris: Secondary | ICD-10-CM | POA: Diagnosis not present

## 2022-10-28 DIAGNOSIS — R931 Abnormal findings on diagnostic imaging of heart and coronary circulation: Secondary | ICD-10-CM

## 2022-10-28 DIAGNOSIS — E7800 Pure hypercholesterolemia, unspecified: Secondary | ICD-10-CM

## 2022-10-28 DIAGNOSIS — I2584 Coronary atherosclerosis due to calcified coronary lesion: Secondary | ICD-10-CM | POA: Diagnosis not present

## 2022-10-28 DIAGNOSIS — Z87891 Personal history of nicotine dependence: Secondary | ICD-10-CM | POA: Diagnosis not present

## 2022-10-28 MED ORDER — NEXLIZET 180-10 MG PO TABS: 1.0000 | ORAL_TABLET | Freq: Every day | ORAL | 2 refills | Status: DC

## 2022-10-28 NOTE — Patient Instructions (Addendum)
Medication Instructions:  Your physician has recommended you make the following change in your medication:  START Nexlizet 180-10 mg once daily  *If you need a refill on your cardiac medications before your next appointment, please call your pharmacy*  Lab Work: Lipid panel in 6 weeks (CMP, direct LDL, FASTING lipids) If you have labs (blood work) drawn today and your tests are completely normal, you will receive your results only by: MyChart Message (if you have MyChart) OR A paper copy in the mail If you have any lab test that is abnormal or we need to change your treatment, we will call you to review the results.  Testing/Procedures: None  Follow-Up: At [facility], you and your health needs are our priority.  As part of our continuing mission to provide you with exceptional heart care, we have created designated Provider Care Teams.  These Care Teams include your primary Cardiologist (physician) and Advanced Practice Providers (APPs -  Physician Assistants and Nurse Practitioners) who all work together to provide you with the care you need, when you need it.  We recommend signing up for the patient portal called "MyChart".  Sign up information is provided on this After Visit Summary.  MyChart is used to connect with patients for Virtual Visits (Telemedicine).  Patients are able to view lab/test results, encounter notes, upcoming appointments, etc.  Non-urgent messages can be sent to your provider as well.   To learn more about what you can do with MyChart, go to [email or web address].    Your next appointment:   1 year(s)  The format for your next appointment:   In Person  Provider:   Tessa Lerner, DO {

## 2022-10-28 NOTE — Progress Notes (Unsigned)
Cardiology Office Note:  .   Date:  10/29/2022  ID:  Dustin Conrad, DOB 02-24-1965, MRN [id number] PCP:  Merri Brunette, MD  Former Cardiology Providers: None [facility] Providers Cardiologist:  Tessa Lerner, DO , [facility] (established care 10/2020) Electrophysiologist:  None  Click to update primary MD,subspecialty MD or APP then REFRESH:1}    Chief Complaint  Patient presents with   Coronary atherosclerosis due to calcified coronary lesion   Follow-up    History of Present Illness: .   Dustin Conrad is a 57 y.o.  male whose past medical history and cardiovascular risk factors includes: hx of prostate cancer, HLD, moderate CAC (114AU, 79th percentile), Bipolar disorder.   Patient was referred to the practice for coronary artery calcification and hyperlipidemia.  He presents today for 1 year follow-up visit.  Over the last 1 year he denies anginal chest pain or heart failure symptoms.  No hospitalizations or urgent care visits for cardiovascular reasons.  Still enjoys doing yoga for 35 minutes on a daily basis and walks 12,000 steps per day.  Review of Systems: .   Review of Systems  Cardiovascular:  Negative for chest pain, claudication, dyspnea on exertion, irregular heartbeat, leg swelling, near-syncope, orthopnea, palpitations, paroxysmal nocturnal dyspnea and syncope.  Respiratory:  Negative for shortness of breath.   Hematologic/Lymphatic: Negative for bleeding problem.  Musculoskeletal:  Negative for muscle cramps and myalgias.  Neurological:  Negative for dizziness and light-headedness.    Studies Reviewed:   EKG: EKG Interpretation Date/Time:  Monday October 28 2022 08:14:20 EDT Ventricular Rate:  87 PR Interval:  158 QRS Duration:  106 QT Interval:  368 QTC Calculation: 442 R Axis:   6  Text Interpretation: Normal sinus rhythm Nonspecific T wave abnormality When compared with ECG of 05-Jul-2015 14:19, Vent. rate has increased BY  39 BPM Nonspecific T wave  abnormality now evident in Lateral leads Confirmed by Tessa Lerner [phone]) on 10/28/2022 8:22:59 AM  Echocardiogram: 11/13/2020: Left ventricle cavity is normal in size. Mild concentric hypertrophy of the left ventricle. Normal global wall motion. Normal LV systolic function with EF 61%. Normal diastolic filling pattern. Structurally normal trileaflet aortic valve. Mild to moderate aortic regurgitation. Mild (Grade I) mitral regurgitation. Mild tricuspid regurgitation. Estimated pulmonary artery systolic pressure 31 mmHg   Stress Testing: Lexiscan/modified Bruce Sestamibi stress test 11/13/2020: Lexiscan/Bruce nuclear stress test performed using 1-day protocol. Patient reached 7.0 METS and 79% MPHR.  In addition, stress EKG at 79% did not show significant ischemic changes. LV cavity is dilated (EDV 178 ml). Normal myocardial perfusion. Stress LVEF 52%. Low risk study.  CORONARY CALCIUM SCORES: 10/13/2020 Left Main: 0   LAD: 12   LCx: 102   RCA: 0   Total Agatston Score: 114   MESA database percentile: 79   AORTA MEASUREMENTS:   Ascending Aorta: 38 mm   Descending Aorta: 28 mm  RADIOLOGY: NA  Risk Assessment/Calculations:   NA   Labs:       Latest Ref Rng & Units 07/05/2015    2:05 PM 07/05/2008    5:10 AM 06/28/2008    8:25 AM  CBC  WBC 4.0 - 10.5 K/uL 6.1   5.9   Hemoglobin 13.0 - 17.0 g/dL 29.5  62.1  30.8   Hematocrit 39.0 - 52.0 % 39.0  36.3  40.5   Platelets 150 - 400 K/uL 287   336        Latest Ref Rng & Units 10/25/2022    8:27 AM 10/16/2021  7:42 AM 02/07/2021    8:27 AM  BMP  Glucose 70 - 99 mg/dL 366  440  347   BUN 6 - 24 mg/dL 16  18  19    Creatinine 0.76 - 1.27 mg/dL 4.25  9.56  3.87   BUN/Creat Ratio 9 - 20 13  13  15    Sodium 134 - 144 mmol/L 136  141  141   Potassium 3.5 - 5.2 mmol/L 4.7  4.4  4.9   Chloride 96 - 106 mmol/L 102  100  104   CO2 20 - 29 mmol/L 22  23  22    Calcium 8.7 - 10.2 mg/dL 9.5  9.8  9.9       Latest Ref Rng  & Units 10/25/2022    8:27 AM 10/16/2021    7:42 AM 02/07/2021    8:27 AM  CMP  Glucose 70 - 99 mg/dL 564  332  951   BUN 6 - 24 mg/dL 16  18  19    Creatinine 0.76 - 1.27 mg/dL 8.84  1.66  0.63   Sodium 134 - 144 mmol/L 136  141  141   Potassium 3.5 - 5.2 mmol/L 4.7  4.4  4.9   Chloride 96 - 106 mmol/L 102  100  104   CO2 20 - 29 mmol/L 22  23  22    Calcium 8.7 - 10.2 mg/dL 9.5  9.8  9.9   Total Protein 6.0 - 8.5 g/dL 6.8  6.9  7.2   Total Bilirubin 0.0 - 1.2 mg/dL 0.6  0.6  0.4   Alkaline Phos 44 - 121 IU/L 70  75  72   AST 0 - 40 IU/L 27  30  39   ALT 0 - 44 IU/L 20  22  30      Lab Results  Component Value Date   CHOL 180 10/25/2022   HDL 44 10/25/2022   LDLCALC 113 (H) 10/25/2022   LDLDIRECT 110 (H) 10/25/2022   TRIG 130 10/25/2022   CHOLHDL 4.1 10/25/2022   No results for input(s): "LIPOA" in the last 8760 hours. No components found for: "NTPROBNP" No results for input(s): "PROBNP" in the last 8760 hours. No results for input(s): "TSH" in the last 8760 hours.  External Labs: 09/14/2020: Total cholesterol 232, triglycerides 62, HDL 54, LDL 166, non-HDL 178   12/11/2020: Total cholesterol 171, HDL 54, triglycerides 60, direct LDL 110, non-HDL 105.   Collected: 09/19/2021 provided by referring physician. Total cholesterol 196, triglycerides 169, HDL 69, LDL 93, non-HDL 127  Collected: 10/02/2022 provided by PCP. Total cholesterol 170, triglycerides 327, HDL 51, LDL calculated 68   Physical Exam:    Today's Vitals   10/28/22 0810  BP: 136/82  Pulse: 87  Resp: 16  SpO2: 97%  Weight: 231 lb 6.4 oz (105 kg)  Height: 6\' 1"  (1.854 m)   Body mass index is 30.53 kg/m. Wt Readings from Last 3 Encounters:  10/28/22 231 lb 6.4 oz (105 kg)  10/22/21 217 lb 6.4 oz (98.6 kg)  12/22/20 211 lb (95.7 kg)    Physical Exam  Constitutional: No distress.  hemodynamically stable  Neck: No JVD present.  Cardiovascular: Normal rate, regular rhythm, S1 normal, S2 normal, intact  distal pulses and normal pulses. Exam reveals no gallop, no S3 and no S4.  No murmur heard. Pulmonary/Chest: Effort normal and breath sounds normal. No stridor. He has no wheezes. He has no rales.  Abdominal: Soft. Bowel sounds are normal. He exhibits no  distension. There is no abdominal tenderness.  Musculoskeletal:        General: No edema.     Cervical back: Neck supple.  Neurological: He is alert and oriented to person, place, and time. He has intact cranial nerves (2-12).  Skin: Skin is warm and moist.     Impression & Recommendation(s):  Impression:   ICD-10-CM   1. Coronary atherosclerosis due to calcified coronary lesion  I25.10 EKG 12-Lead   I25.84 Direct LDL    Comp Met (CMET)    Lipid panel    2. Agatston coronary artery calcium score between 100 and 199  R93.1     3. Pure hypercholesterolemia  E78.00 Direct LDL    Comp Met (CMET)    Lipid panel    4. Former smoker  Z87.891        Recommendation(s):  Coronary atherosclerosis due to calcified coronary lesion Agatston coronary artery calcium score between 100 and 199 Total CAC 114, 79th percentile. Index LDL was at least 166 mg/dL. Since then has been on aspirin and statin therapy. Most recent lipid profile notes a direct LDL of 110 mg/dL. Patient is currently on the maximally tolerated dose of statin which is Crestor 20 mg p.o. nightly. He is willing to be on Nexlizet to further optimize his LDL levels. After he has been on Nexlizet for 6 weeks will have fasting lipids.  Pure hypercholesterolemia Currently on Crestor. Recent labs from October 2024 independently reviewed and noted above. Up titration of medical therapy as discussed above  Former smoker Re emphasize importance of continued complete smoking cessation.   Orders Placed:  Orders Placed This Encounter  Procedures   Direct LDL    Standing Status:   Future    Standing Expiration Date:   10/28/2023   Comp Met (CMET)    Standing Status:    Future    Standing Expiration Date:   10/28/2023   Lipid panel    Standing Status:   Future    Standing Expiration Date:   10/28/2023   EKG 12-Lead    As part of medical decision making results of the EKG, labs, prior echo and stress test results were reviewed independently at today's visit.   Final Medication List:    Meds ordered this encounter  Medications   Bempedoic Acid-Ezetimibe (NEXLIZET) 180-10 MG TABS    Sig: Take 1 tablet by mouth daily.    Dispense:  30 tablet    Refill:  2    There are no discontinued medications.   Current Outpatient Medications:    ARIPiprazole (ABILIFY) 10 MG tablet, Take 10 mg by mouth every morning., Disp: , Rfl:    ASPIRIN LOW DOSE 81 MG tablet, TAKE 1 TABLET(81 MG) BY MOUTH DAILY. SWALLOW WHOLE, Disp: 90 tablet, Rfl: 3   Bempedoic Acid-Ezetimibe (NEXLIZET) 180-10 MG TABS, Take 1 tablet by mouth daily., Disp: 30 tablet, Rfl: 2   cholecalciferol (VITAMIN D) 400 units TABS tablet, Take 400 Units by mouth daily., Disp: , Rfl:    fluticasone (FLONASE) 50 MCG/ACT nasal spray, Place 1 spray into both nostrils daily., Disp: , Rfl:    Glucosamine-Chondroit-Vit C-Mn (GLUCOSAMINE 1500 COMPLEX PO), Take by mouth., Disp: , Rfl:    lithium carbonate 300 MG capsule, Take 300 mg by mouth 2 (two) times daily., Disp: , Rfl:    Loratadine 10 MG CAPS, Take 1 capsule by mouth daily at 12 noon., Disp: , Rfl:    meloxicam (MOBIC) 15 MG tablet, Take 15 mg by  mouth daily., Disp: , Rfl:    pantoprazole (PROTONIX) 40 MG tablet, Take 40 mg by mouth daily., Disp: , Rfl:    rosuvastatin (CRESTOR) 20 MG tablet, TAKE 1 TABLET(20 MG) BY MOUTH AT BEDTIME, Disp: 90 tablet, Rfl: 3   sildenafil (REVATIO) 20 MG tablet, TAKE 2 TO 5 TABLETS BY MOUTH ONCE A DAY AS NEEDED, Disp: , Rfl:    tamsulosin (FLOMAX) 0.4 MG CAPS capsule, Take 0.4 mg by mouth daily., Disp: , Rfl:    zolpidem (AMBIEN CR) 12.5 MG CR tablet, Take 1 tablet by mouth at bedtime as needed., Disp: , Rfl:    cetirizine  (ZYRTEC) 10 MG tablet, Take 10 mg by mouth daily., Disp: , Rfl:   Consent:   NA  Disposition:   Return in about 1 year (around 10/28/2023) for Annual follow up visit, Coronary artery calcification, Lipid. or sooner if needed.  His questions and concerns were addressed to his satisfaction. He voices understanding of the recommendations provided during this encounter.    Signed, Tessa Lerner, DO, [facility]  [facility]  [address] [place], [place] [address] 10/29/2022 8:17 AM

## 2022-10-29 ENCOUNTER — Encounter: Payer: Self-pay | Admitting: Cardiology

## 2022-11-14 ENCOUNTER — Encounter: Payer: Self-pay | Admitting: Registered Nurse

## 2022-11-19 ENCOUNTER — Telehealth: Payer: Self-pay | Admitting: Cardiology

## 2022-11-19 NOTE — Telephone Encounter (Signed)
Pt is returning call to nurse for results. Patient would like a call back

## 2022-11-19 NOTE — Telephone Encounter (Signed)
-----   Message from [facility] sent at 11/17/2022  6:29 PM EST ----- External Labs: Collected: 11/13/2022 primary care provider. AST ALT within normal limits  Sunit Vergennes, DO, [facility]

## 2022-11-19 NOTE — Telephone Encounter (Signed)
The patient has been notified of the result and verbalized understanding.  All questions (if any) were answered. Frutoso Schatz, RN 11/19/2022 10:33 AM

## 2022-12-09 ENCOUNTER — Ambulatory Visit: Payer: BC Managed Care – PPO | Attending: Cardiology

## 2022-12-09 DIAGNOSIS — I2510 Atherosclerotic heart disease of native coronary artery without angina pectoris: Secondary | ICD-10-CM

## 2022-12-09 DIAGNOSIS — E7800 Pure hypercholesterolemia, unspecified: Secondary | ICD-10-CM

## 2022-12-09 LAB — LIPID PANEL
Chol/HDL Ratio: 2.6 {ratio} (ref 0.0–5.0)
Cholesterol, Total: 105 mg/dL (ref 100–199)
HDL: 40 mg/dL (ref >39)
LDL Chol Calc (NIH): 50 mg/dL (ref 0–99)
Triglycerides: 71 mg/dL (ref 0–149)
VLDL Cholesterol Cal: 15 mg/dL (ref 5–40)

## 2022-12-09 LAB — COMPREHENSIVE METABOLIC PANEL
ALT: 22 [IU]/L (ref 0–44)
AST: 32 [IU]/L (ref 0–40)
Albumin: 4.7 g/dL (ref 3.8–4.9)
Alkaline Phosphatase: 57 [IU]/L (ref 44–121)
BUN/Creatinine Ratio: 13 (ref 9–20)
BUN: 17 mg/dL (ref 6–24)
Bilirubin Total: 0.5 mg/dL (ref 0.0–1.2)
CO2: 22 mmol/L (ref 20–29)
Calcium: 10 mg/dL (ref 8.7–10.2)
Chloride: 103 mmol/L (ref 96–106)
Creatinine, Ser: 1.32 mg/dL — ABNORMAL HIGH (ref 0.76–1.27)
Globulin, Total: 2.1 g/dL (ref 1.5–4.5)
Glucose: 115 mg/dL — ABNORMAL HIGH (ref 70–99)
Potassium: 5.2 mmol/L (ref 3.5–5.2)
Sodium: 137 mmol/L (ref 134–144)
Total Protein: 6.8 g/dL (ref 6.0–8.5)
eGFR: 63 mL/min/{1.73_m2} (ref >59)

## 2022-12-09 LAB — LDL CHOLESTEROL, DIRECT: LDL Direct: 48 mg/dL (ref 0–99)

## 2022-12-24 ENCOUNTER — Other Ambulatory Visit: Payer: Self-pay | Admitting: Cardiology

## 2023-01-27 ENCOUNTER — Other Ambulatory Visit: Payer: Self-pay | Admitting: Cardiology

## 2023-01-27 DIAGNOSIS — R931 Abnormal findings on diagnostic imaging of heart and coronary circulation: Secondary | ICD-10-CM

## 2023-01-27 DIAGNOSIS — I2584 Coronary atherosclerosis due to calcified coronary lesion: Secondary | ICD-10-CM

## 2023-09-06 IMAGING — CT CT CARDIAC CORONARY ARTERY CALCIUM SCORE
3 series · 13 of 20 positions shown, 15 images · non-contrast
Comparison: None.

CLINICAL DATA: 55-year-old Caucasian male with history of
hyperlipidemia and family history of heart disease.

EXAM:
CT CARDIAC CORONARY ARTERY CALCIUM SCORE
TECHNIQUE: Non-contrast imaging through the heart was performed using
prospective ECG gating. Image post processing was performed on an
independent workstation, allowing for quantitative analysis of the
heart and coronary arteries. Note that this exam targets the heart
and the chest was not imaged in its entirety.

[Series 2: calcium scoring 2.00 qr36 bestdiast 70% hrt calciu · axial · 0.38mm/px · z∈[+1790,+1846]mm · 3 of 70 slices shown]
[im 14/70  vessel]
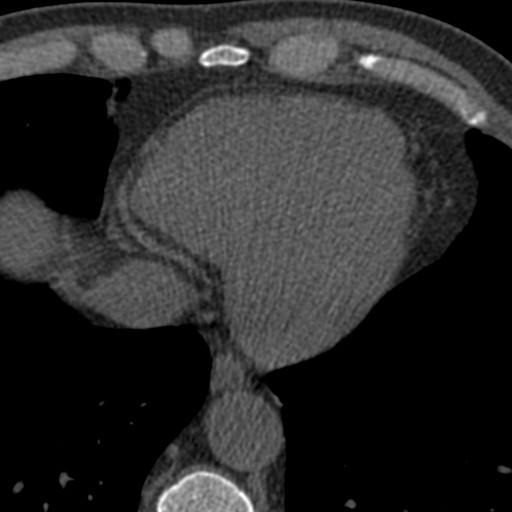
[im 28/70  vessel]
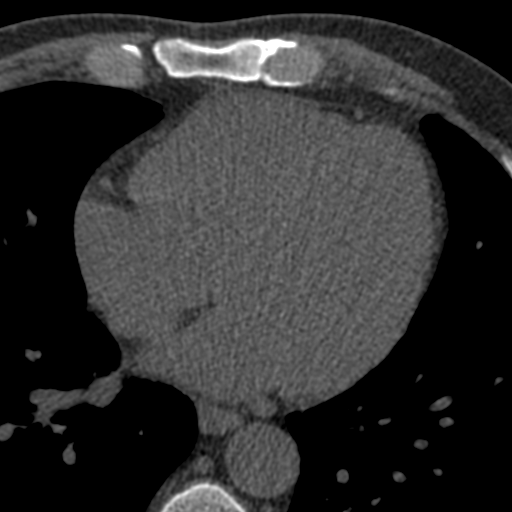
[im 42/70  vessel]
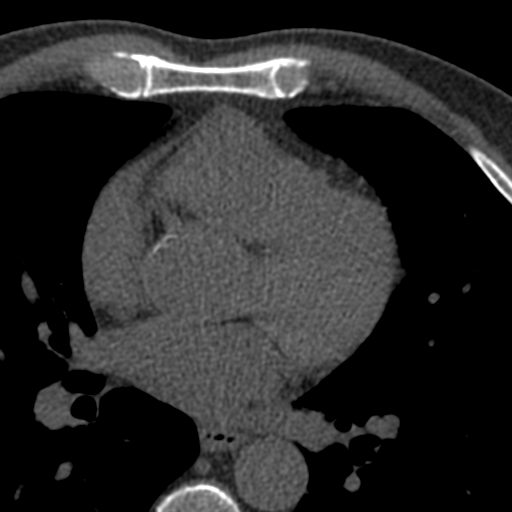

[Series 3: calcium scoring 2.00 br40 bestdiast 70% axial · axial · 0.62mm/px · z∈[+1786,+1878]mm · 5 of 70 slices shown, 7 images]
[im 12/70  vessel]
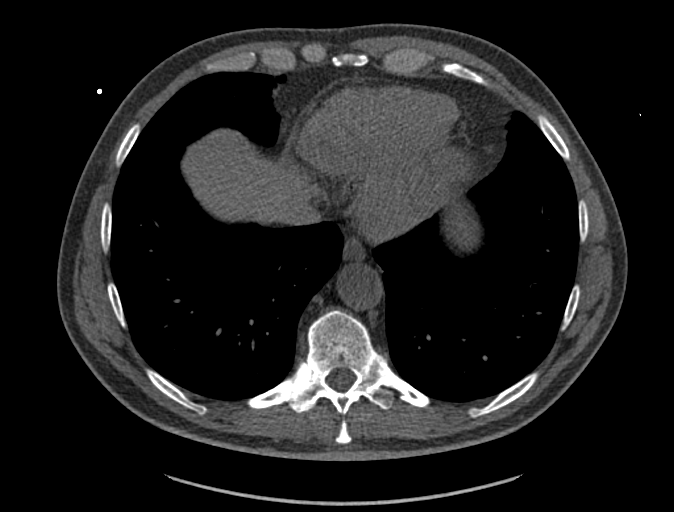
[im 12/70  lung]
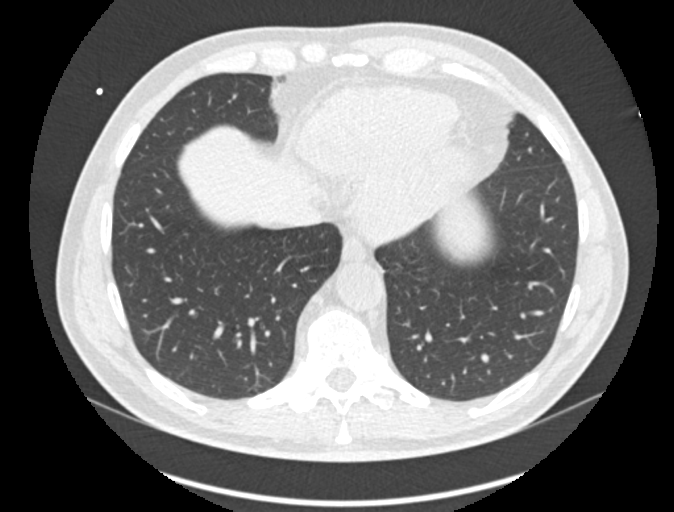
[im 24/70  vessel]
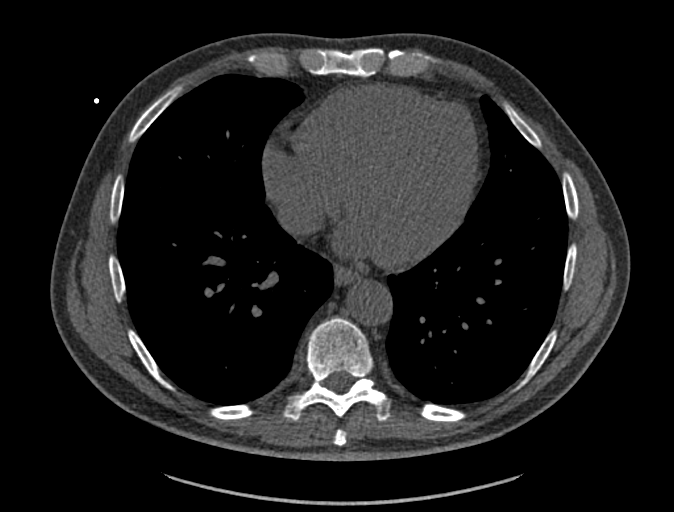
[im 35/70  vessel]
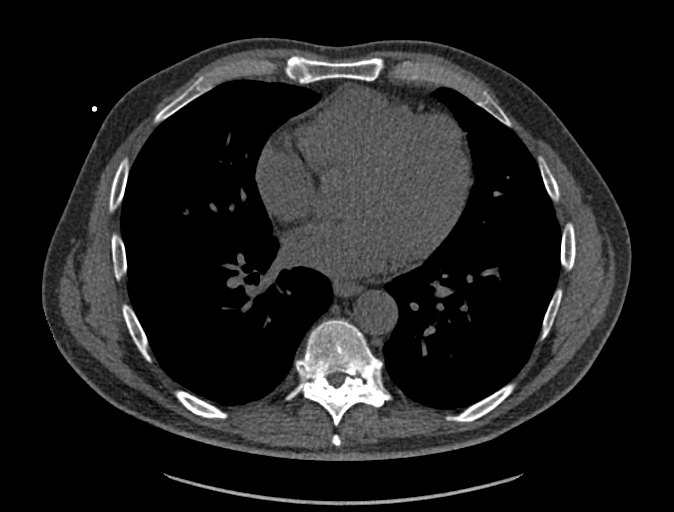
[im 47/70  vessel]
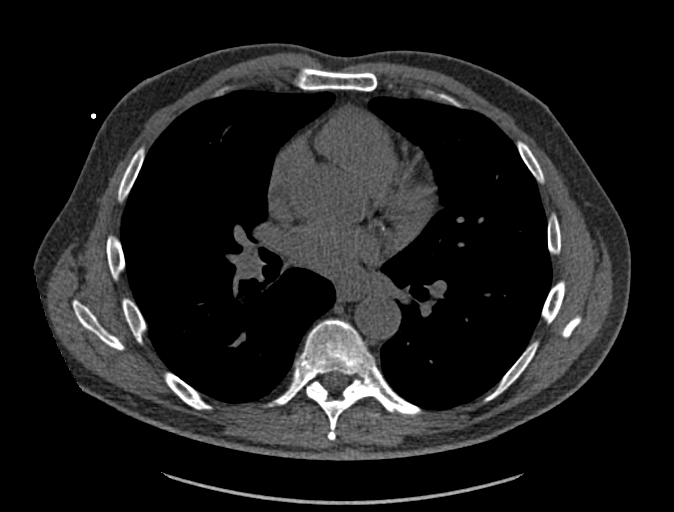
[im 58/70  vessel]
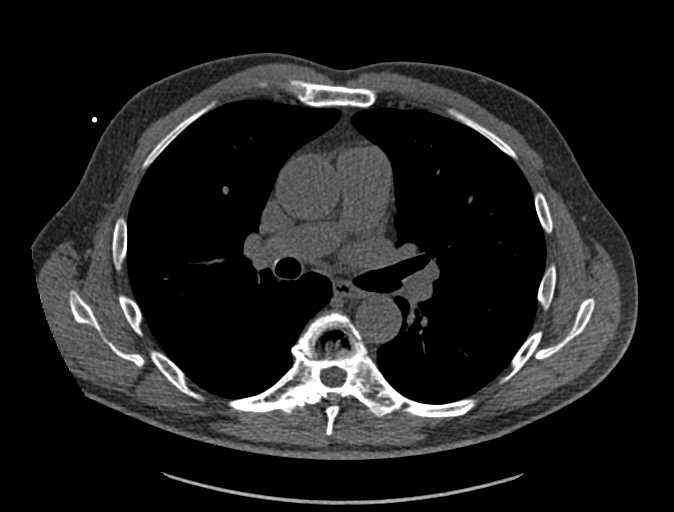
[im 58/70  lung]
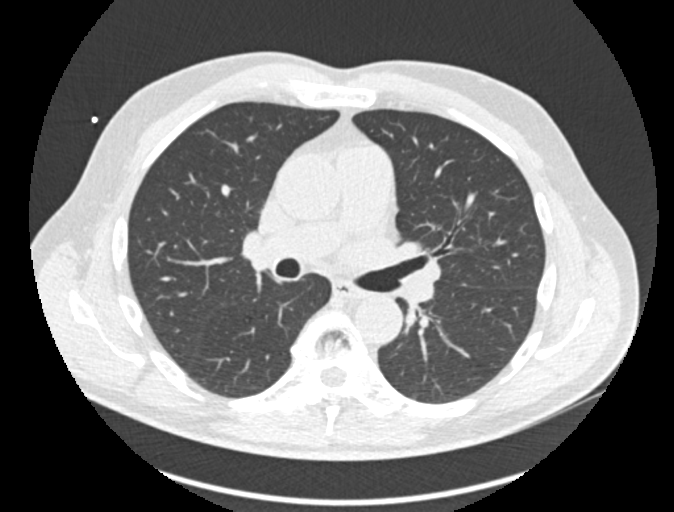

[Series 9: calcium scoring 2.00 br60 bestdiast 70% lungs · axial · 0.62mm/px · z∈[+1786,+1878]mm · 5 of 70 slices shown]
[im 12/70  vessel]
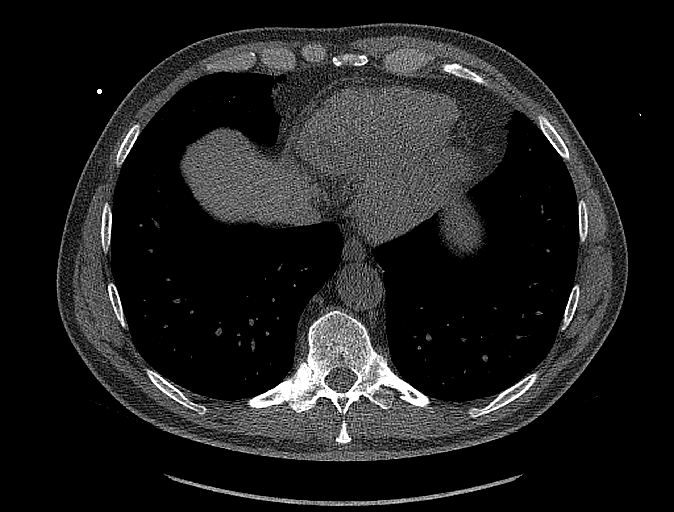
[im 24/70  vessel]
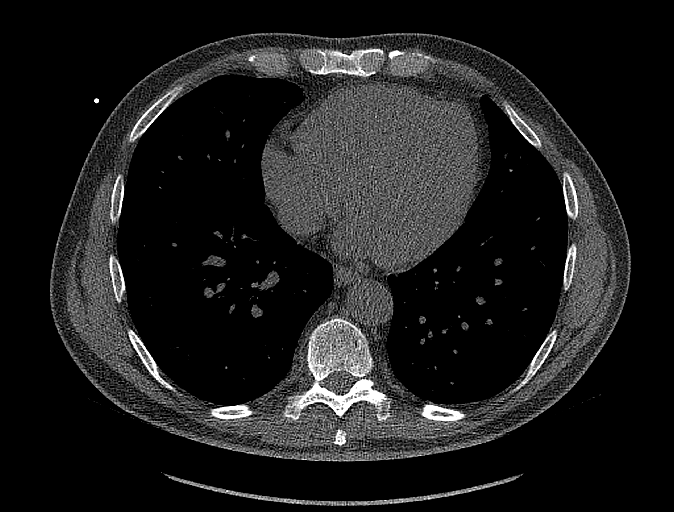
[im 35/70  vessel]
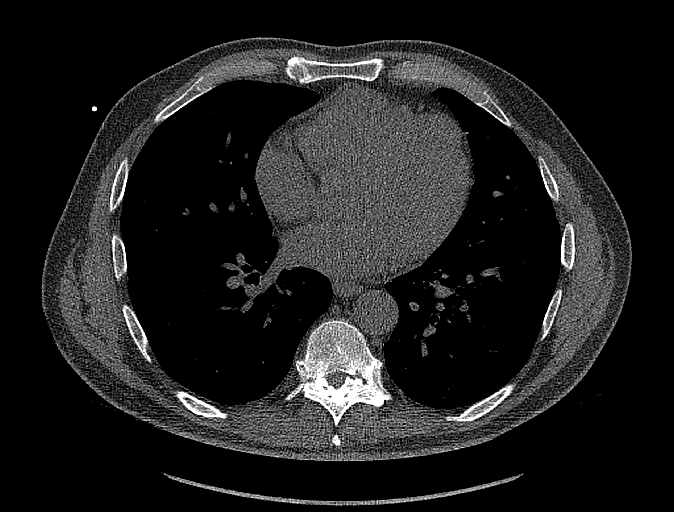
[im 47/70  vessel]
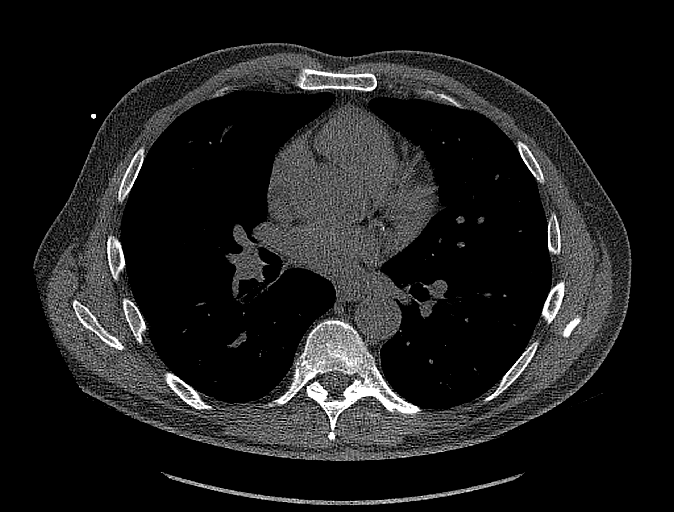
[im 58/70  vessel]
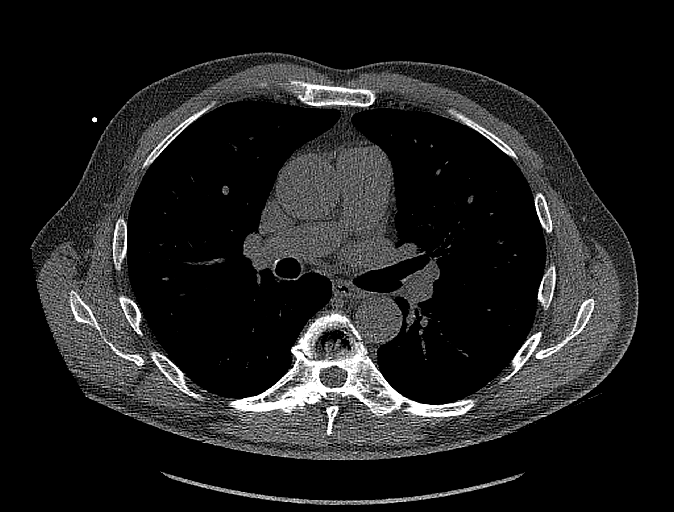

[13 of 20 positions shown; findings below may reference images not displayed]

FINDINGS: CORONARY CALCIUM SCORES:

Left Main: 0

LAD: 12

LCx: 102

RCA: 0

Total Agatston Score: 114

[HOSPITAL] percentile: 79

AORTA MEASUREMENTS:

Ascending Aorta: 38 mm

Descending Aorta: 28 mm

OTHER FINDINGS:

The heart size is within normal limits. No pericardial fluid is
identified. Visualized segments of the thoracic aorta and central
pulmonary arteries are normal in caliber. Visualized mediastinum and
hilar regions demonstrate no lymphadenopathy or masses. Suggestion
of very vague nodular density in the subpleural anterolateral right
upper lobe on image number [DATE] measuring 3 mm. Visualized lungs show
no evidence of pulmonary edema, consolidation, pneumothorax or
pleural fluid. Visualized upper abdomen and bony structures are
unremarkable.
IMPRESSION: 1. Coronary calcium score of 114 is at the 79th percentile for the
patient's age, sex and race.
2. Very vague 3 mm nodular density in the right upper lobe.
If patient is low risk for malignancy, no routine follow-up imaging
is recommended; if patient is high risk for malignancy, a
non-contrast Chest CT at 12 months is optional. If performed and the
nodule is stable at 12 months, no further follow-up is recommended.
These guidelines do not apply to immunocompromised patients and
patients with cancer. Follow up in patients with significant
comorbidities as clinically warranted. For lung cancer screening,
adhere to Lung-RADS guidelines. Reference: Radiology. 8538;

## 2023-09-18 ENCOUNTER — Other Ambulatory Visit: Payer: Self-pay | Admitting: Cardiology

## 2023-09-18 DIAGNOSIS — I2510 Atherosclerotic heart disease of native coronary artery without angina pectoris: Secondary | ICD-10-CM

## 2023-09-18 DIAGNOSIS — E7800 Pure hypercholesterolemia, unspecified: Secondary | ICD-10-CM

## 2023-09-18 DIAGNOSIS — R931 Abnormal findings on diagnostic imaging of heart and coronary circulation: Secondary | ICD-10-CM

## 2023-10-08 LAB — LAB REPORT - SCANNED
A1c: 5.6
EGFR: 66

## 2023-10-18 ENCOUNTER — Other Ambulatory Visit: Payer: Self-pay | Admitting: Cardiology

## 2023-10-18 DIAGNOSIS — I2510 Atherosclerotic heart disease of native coronary artery without angina pectoris: Secondary | ICD-10-CM

## 2023-10-18 DIAGNOSIS — R931 Abnormal findings on diagnostic imaging of heart and coronary circulation: Secondary | ICD-10-CM

## 2023-10-18 DIAGNOSIS — E7800 Pure hypercholesterolemia, unspecified: Secondary | ICD-10-CM

## 2023-10-23 ENCOUNTER — Ambulatory Visit: Payer: Self-pay | Admitting: Cardiology

## 2023-10-24 ENCOUNTER — Other Ambulatory Visit: Payer: Self-pay | Admitting: Cardiology

## 2023-11-14 ENCOUNTER — Other Ambulatory Visit: Payer: Self-pay | Admitting: Cardiology

## 2023-11-14 DIAGNOSIS — R931 Abnormal findings on diagnostic imaging of heart and coronary circulation: Secondary | ICD-10-CM

## 2023-11-14 DIAGNOSIS — E7800 Pure hypercholesterolemia, unspecified: Secondary | ICD-10-CM

## 2023-11-14 DIAGNOSIS — I2510 Atherosclerotic heart disease of native coronary artery without angina pectoris: Secondary | ICD-10-CM

## 2023-12-19 ENCOUNTER — Other Ambulatory Visit: Payer: Self-pay | Admitting: Cardiology

## 2023-12-19 DIAGNOSIS — I2510 Atherosclerotic heart disease of native coronary artery without angina pectoris: Secondary | ICD-10-CM

## 2023-12-19 DIAGNOSIS — R931 Abnormal findings on diagnostic imaging of heart and coronary circulation: Secondary | ICD-10-CM

## 2023-12-19 DIAGNOSIS — E7800 Pure hypercholesterolemia, unspecified: Secondary | ICD-10-CM

## 2023-12-19 NOTE — Telephone Encounter (Signed)
  Patient called to follow up refill. Patient made an appointment with Dr. Michele on 01/22/24 at 9:20 am

## 2023-12-24 ENCOUNTER — Telehealth: Payer: Self-pay | Admitting: Cardiology

## 2023-12-24 ENCOUNTER — Other Ambulatory Visit: Payer: Self-pay | Admitting: Cardiology

## 2023-12-24 DIAGNOSIS — I2510 Atherosclerotic heart disease of native coronary artery without angina pectoris: Secondary | ICD-10-CM

## 2023-12-24 DIAGNOSIS — R931 Abnormal findings on diagnostic imaging of heart and coronary circulation: Secondary | ICD-10-CM

## 2023-12-24 DIAGNOSIS — E7800 Pure hypercholesterolemia, unspecified: Secondary | ICD-10-CM

## 2023-12-24 MED ORDER — ROSUVASTATIN CALCIUM 20 MG PO TABS: 20.0000 mg | ORAL_TABLET | Freq: Every day | ORAL | 0 refills | Status: DC

## 2023-12-24 NOTE — Telephone Encounter (Signed)
 Pt scheduled to see Dr. Michele 01/22/24, refill sent.

## 2023-12-24 NOTE — Telephone Encounter (Signed)
°*  STAT* If patient is at the pharmacy, call can be transferred to refill team.   1. Which medications need to be refilled? (please list name of each medication and dose if known)   rosuvastatin  (CRESTOR ) 20 MG tablet     2. Would you like to learn more about the convenience, safety, & potential cost savings by using the [facility]? no   3. Are you open to using the [facility] (Type [facility].  ). No    4. Which pharmacy/location (including street and city if local pharmacy) is medication to be sent to?[id number] - [place], [place] - [address] [facility] [address]    5. Do they need a 30 day or 90 day supply? 30 day

## 2024-01-22 ENCOUNTER — Ambulatory Visit: Attending: Cardiology | Admitting: Cardiology

## 2024-01-22 ENCOUNTER — Encounter: Payer: Self-pay | Admitting: Cardiology

## 2024-01-22 VITALS — BP 130/78 | HR 83 | Resp 16 | Ht 73.0 in | Wt 228.0 lb

## 2024-01-22 DIAGNOSIS — Z87891 Personal history of nicotine dependence: Secondary | ICD-10-CM | POA: Diagnosis not present

## 2024-01-22 DIAGNOSIS — R931 Abnormal findings on diagnostic imaging of heart and coronary circulation: Secondary | ICD-10-CM

## 2024-01-22 DIAGNOSIS — I2584 Coronary atherosclerosis due to calcified coronary lesion: Secondary | ICD-10-CM

## 2024-01-22 DIAGNOSIS — I2510 Atherosclerotic heart disease of native coronary artery without angina pectoris: Secondary | ICD-10-CM | POA: Diagnosis not present

## 2024-01-22 DIAGNOSIS — E7800 Pure hypercholesterolemia, unspecified: Secondary | ICD-10-CM

## 2024-01-22 MED ORDER — ROSUVASTATIN CALCIUM 20 MG PO TABS: 20.0000 mg | ORAL_TABLET | Freq: Every day | ORAL | 0 refills | Status: DC

## 2024-01-22 MED ORDER — NEXLIZET 180-10 MG PO TABS: 1.0000 | ORAL_TABLET | Freq: Every day | ORAL | 0 refills | Status: DC

## 2024-01-22 NOTE — Progress Notes (Signed)
 " Cardiology Office Note:  .   Date:  01/22/2024  ID:  Dustin Conrad, DOB 1965-06-26, MRN [id number] PCP:  Dustin Nottingham, MD  Former Cardiology Providers: None [facility] Providers Cardiologist:  Madonna Large, DO , [facility] (established care 10/2020) Electrophysiologist:  None  Click to update primary MD,subspecialty MD or APP then REFRESH:1}    Chief Complaint  Patient presents with   <9>Coronary atherosclerosis due to calcified coronary lesion   Follow-up    History of Present Illness: .   Dustin Conrad is a 59 y.o.  male whose past medical history and cardiovascular risk factors includes: hx of prostate cancer, HLD, moderate CAC (114AU, 79th percentile), Bipolar disorder.   Patient was referred to the practice for coronary artery calcification and hyperlipidemia.    Since last office visit Jeffery denies any anginal chest pain or heart failure symptoms.   No hospitalizations or urgent care visits for cardiovascular reasons.   He has been compliant with his medical therapy and endorses no concerns.  Physical endurance remains stable - stationary bike 40 minutes for 4-5 days/week, yoga 25 minutes, 10K/day Home SBP are now better controlled - around 130/75 mmHg (was started on ARB summer of 2025).    Review of Systems: .   Review of Systems  Cardiovascular:  Negative for chest pain, claudication, dyspnea on exertion, irregular heartbeat, leg swelling, near-syncope, orthopnea, palpitations, paroxysmal nocturnal dyspnea and syncope.  Respiratory:  Negative for shortness of breath.   Hematologic/Lymphatic: Negative for bleeding problem.  Musculoskeletal:  Negative for muscle cramps and myalgias.  Neurological:  Negative for dizziness and light-headedness.   Studies Reviewed:   EKG: EKG Interpretation Date/Time:  Thursday January 22 2024 09:22:10 EST Ventricular Rate:  86 PR Interval:  160 QRS Duration:  102 QT Interval:  372 QTC Calculation: 445 R Axis:   64  Text  Interpretation: Normal sinus rhythm Incomplete right bundle branch block When compared with ECG of 28-Oct-2022 08:14, Nonspecific T wave abnormality no longer evident in Anterior leads Confirmed by Large Madonna [phone]) on 01/22/2024 9:33:03 AM  Echocardiogram: 11/13/2020: Left ventricle cavity is normal in size. Mild concentric hypertrophy of the left ventricle. Normal global wall motion. Normal LV systolic function with EF 61%. Normal diastolic filling pattern. Structurally normal trileaflet aortic valve. Mild to moderate aortic regurgitation. Mild (Grade I) mitral regurgitation. Mild tricuspid regurgitation. Estimated pulmonary artery systolic pressure 31 mmHg   Stress Testing: Lexiscan /modified Bruce Sestamibi stress test 11/13/2020: Low risk study.  CORONARY CALCIUM  SCORES: 10/13/2020 Left Main: 0   LAD: 12   LCx: 102   RCA: 0   Total Agatston Score: 114   MESA database percentile: 79   AORTA MEASUREMENTS:   Ascending Aorta: 38 mm   Descending Aorta: 28 mm  RADIOLOGY: NA  Risk Assessment/Calculations:   NA   Labs:       Latest Ref Rng & Units 07/05/2015    2:05 PM 07/05/2008    5:10 AM 06/28/2008    8:25 AM  CBC  WBC 4.0 - 10.5 K/uL 6.1   5.9   Hemoglobin 13.0 - 17.0 g/dL 87.7  87.8  86.0   Hematocrit 39.0 - 52.0 % 39.0  36.3  40.5   Platelets 150 - 400 K/uL 287   336        Latest Ref Rng & Units 12/09/2022    8:14 AM 10/25/2022    8:27 AM 10/16/2021    7:42 AM  BMP  Glucose 70 - 99 mg/dL 884  110  105   BUN 6 - 24 mg/dL 17  16  18    Creatinine 0.76 - 1.27 mg/dL 8.67  8.80  8.56   BUN/Creat Ratio 9 - 20 13  13  13    Sodium 134 - 144 mmol/L 137  136  141   Potassium 3.5 - 5.2 mmol/L 5.2  4.7  4.4   Chloride 96 - 106 mmol/L 103  102  100   CO2 20 - 29 mmol/L 22  22  23    Calcium  8.7 - 10.2 mg/dL 89.9  9.5  9.8       Latest Ref Rng & Units 12/09/2022    8:14 AM 10/25/2022    8:27 AM 10/16/2021    7:42 AM  CMP  Glucose 70 - 99 mg/dL 884  889  894    BUN 6 - 24 mg/dL 17  16  18    Creatinine 0.76 - 1.27 mg/dL 8.67  8.80  8.56   Sodium 134 - 144 mmol/L 137  136  141   Potassium 3.5 - 5.2 mmol/L 5.2  4.7  4.4   Chloride 96 - 106 mmol/L 103  102  100   CO2 20 - 29 mmol/L 22  22  23    Calcium  8.7 - 10.2 mg/dL 89.9  9.5  9.8   Total Protein 6.0 - 8.5 g/dL 6.8  6.8  6.9   Total Bilirubin 0.0 - 1.2 mg/dL 0.5  0.6  0.6   Alkaline Phos 44 - 121 IU/L 57  70  75   AST 0 - 40 IU/L 32  27  30   ALT 0 - 44 IU/L 22  20  22      Lab Results  Component Value Date   CHOL 105 12/09/2022   HDL 40 12/09/2022   LDLCALC 50 12/09/2022   LDLDIRECT 48 12/09/2022   TRIG 71 12/09/2022   CHOLHDL 2.6 12/09/2022   No results for input(s): LIPOA in the last 8760 hours. No components found for: NTPROBNP No results for input(s): PROBNP in the last 8760 hours. No results for input(s): TSH in the last 8760 hours.  External Labs: 09/14/2020: Total cholesterol 232, triglycerides 62, HDL 54, LDL 166, non-HDL 178   12/11/2020: Total cholesterol 171, HDL 54, triglycerides 60, direct LDL 110, non-HDL 105.   Collected: 09/19/2021 provided by referring physician. Total cholesterol 196, triglycerides 169, HDL 69, LDL 93, non-HDL 127  Collected: 10/02/2022 provided by PCP. Total cholesterol 170, triglycerides 327, HDL 51, LDL calculated 68  External Labs: Collected: October 08, 2023 provided by PCP. Hemoglobin 14.6 g/dL. BUN 18, creatinine 1.26. eGFR 66 potassium 4.8. AST, ALT, alkaline phosphatase within normal limits A1c 5.6. Total cholesterol 106, triglycerides 103, HDL 37, LDL 50  Physical Exam:    Today's Vitals   01/22/24 0919  BP: 130/78  Pulse: 83  Resp: 16  SpO2: 96%  Weight: 228 lb (103.4 kg)  Height: 6' 1 (1.854 m)   Body mass index is 30.08 kg/m. Wt Readings from Last 3 Encounters:  01/22/24 228 lb (103.4 kg)  10/28/22 231 lb 6.4 oz (105 kg)  10/22/21 217 lb 6.4 oz (98.6 kg)    Physical Exam  Constitutional: No distress.   hemodynamically stable  Neck: No JVD present.  Cardiovascular: Normal rate, regular rhythm, S1 normal, S2 normal, intact distal pulses and normal pulses. Exam reveals no gallop, no S3 and no S4.  No murmur heard. Pulmonary/Chest: Effort normal and breath sounds normal. No stridor. He has  no wheezes. He has no rales.  Abdominal: Soft. Bowel sounds are normal. He exhibits no distension. There is no abdominal tenderness.  Musculoskeletal:        General: No edema.     Cervical back: Neck supple.  Neurological: He is alert and oriented to person, place, and time. He has intact cranial nerves (2-12).  Skin: Skin is warm and moist.     Impression & Recommendation(s):  Impression:   ICD-10-CM   1. Coronary atherosclerosis due to calcified coronary lesion  I25.10 EKG 12-Lead   I25.84 ECHOCARDIOGRAM COMPLETE    rosuvastatin  (CRESTOR ) 20 MG tablet    2. Agatston coronary artery calcium  score between 100 and 199  R93.1 rosuvastatin  (CRESTOR ) 20 MG tablet    3. Pure hypercholesterolemia  E78.00 rosuvastatin  (CRESTOR ) 20 MG tablet    4. Former smoker  Z87.891        Recommendation(s):  Coronary atherosclerosis due to calcified coronary lesion Agatston coronary artery calcium  score between 100 and 199 Total CAC 114, 79th percentile. Index LDL was at least 166 mg/dL most recent outside labs from September 2025 notes LDL at 50 mg/dL on current therapy. EKG today is nonischemic. Continue antiplatelets and lipid-lowering agents Reemphasize importance of improving his modifiable cardiovascular risk factors. Plan echocardiogram in December 2026 to reevaluate LVEF and valvular heart disease.  With follow-up in January 2027 sooner if needed.  Pure hypercholesterolemia Continue Crestor  20 mg p.o. daily, medication refilled. Continue Nexlizet  180/10 mg p.o. daily, medication refilled  Outside labs from September 2025 independently reviewed and mentioned above for reference  Former  smoker Reemphasized importance of continued complete smoking cessation.   Orders Placed:  Orders Placed This Encounter  Procedures   EKG 12-Lead   ECHOCARDIOGRAM COMPLETE    Standing Status:   Future    Expected Date:   02/22/2024    Expiration Date:   01/21/2025    Where should this test [facility] performed[facility]    Does the patient weigh less than or greater than 250 lbs?:   Patient weighs less than 250 lbs    Perflutren DEFINITY (image enhancing agent) should be administered unless hypersensitivity or allergy exist:   Administer Perflutren    Reason for exam-Echo:   Other-Full Diagnosis List    Full ICD-10/Reason for Exam:   Coronary artery calcification [8805182]    Final Medication List:    Meds ordered this encounter  Medications   rosuvastatin  (CRESTOR ) 20 MG tablet    Sig: Take 1 tablet (20 mg total) by mouth daily.    Dispense:  30 tablet    Refill:  0    Pt must keep appt in January for further refills.   Bempedoic Acid-Ezetimibe (NEXLIZET ) 180-10 MG TABS    Sig: Take 1 tablet by mouth daily.    Dispense:  60 tablet    Refill:  0    Please schedule upcoming appt due for future refills [phone]. Thank you    Medications Discontinued During This Encounter  Medication Reason   Bempedoic Acid-Ezetimibe (NEXLIZET ) 180-10 MG TABS Reorder   rosuvastatin  (CRESTOR ) 20 MG tablet Reorder     Current Outpatient Medications:    olmesartan (BENICAR) 40 MG tablet, Take 40 mg by mouth daily., Disp: , Rfl:    ARIPiprazole (ABILIFY) 10 MG tablet, Take 10 mg by mouth every morning., Disp: , Rfl:    ASPIRIN  LOW DOSE 81 MG tablet, TAKE 1 TABLET(81 MG) BY MOUTH DAILY. SWALLOW WHOLE, Disp: 90  tablet, Rfl: 3   Bempedoic Acid-Ezetimibe (NEXLIZET ) 180-10 MG TABS, Take 1 tablet by mouth daily., Disp: 60 tablet, Rfl: 0   cholecalciferol (VITAMIN D) 400 units TABS tablet, Take 400 Units by mouth daily., Disp: , Rfl:    fluticasone  (FLONASE ) 50 MCG/ACT nasal spray, Place 1 spray  into both nostrils daily., Disp: , Rfl:    Glucosamine-Chondroit-Vit C-Mn (GLUCOSAMINE 1500 COMPLEX PO), Take by mouth., Disp: , Rfl:    lithium  carbonate 300 MG capsule, Take 300 mg by mouth 2 (two) times daily., Disp: , Rfl:    Loratadine  10 MG CAPS, Take 1 capsule by mouth daily at 12 noon., Disp: , Rfl:    meloxicam (MOBIC) 15 MG tablet, Take 15 mg by mouth daily., Disp: , Rfl:    pantoprazole  (PROTONIX ) 40 MG tablet, Take 40 mg by mouth daily., Disp: , Rfl:    rosuvastatin  (CRESTOR ) 20 MG tablet, Take 1 tablet (20 mg total) by mouth daily., Disp: 30 tablet, Rfl: 0   sildenafil (REVATIO) 20 MG tablet, TAKE 2 TO 5 TABLETS BY MOUTH ONCE A DAY AS NEEDED, Disp: , Rfl:    tamsulosin (FLOMAX) 0.4 MG CAPS capsule, Take 0.4 mg by mouth daily., Disp: , Rfl:    zolpidem (AMBIEN CR) 12.5 MG CR tablet, Take 1 tablet by mouth at bedtime as needed., Disp: , Rfl:   Consent:   NA  Disposition:   1 year follow-up  His questions and concerns were addressed to his satisfaction. He voices understanding of the recommendations provided during this encounter.    Signed, Madonna Michele HAS, [facility]  A Division of [facility] [address] [place], [place] [address]   01/22/2024 11:43 AM "

## 2024-01-22 NOTE — Patient Instructions (Signed)
 Medication Instructions:  Your physician recommends that you continue on your current medications as directed. Please refer to the Current Medication list given to you today.  *If you need a refill on your cardiac medications before your next appointment, please call your pharmacy*  Lab Work: None ordered If you have labs (blood work) drawn today and your tests are completely normal, you will receive your results only by: MyChart Message (if you have MyChart) OR A paper copy in the mail If you have any lab test that is abnormal or we need to change your treatment, we will call you to review the results.  Testing/Procedures: Echocardiogram in Dec.   Follow-Up: At [facility], you and your health needs are our priority.  As part of our continuing mission to provide you with exceptional heart care, our providers are all part of one team.  This team includes your primary Cardiologist (physician) and Advanced Practice Providers or APPs (Physician Assistants and Nurse Practitioners) who all work together to provide you with the care you need, when you need it.  Your next appointment:   1 year(s)  Provider:   Madonna Large, DO    We recommend signing up for the patient portal called MyChart.  Sign up information is provided on this After Visit Summary.  MyChart is used to connect with patients for Virtual Visits (Telemedicine).  Patients are able to view lab/test results, encounter notes, upcoming appointments, etc.  Non-urgent messages can be sent to your provider as well.   To learn more about what you can do with MyChart, go to [email or web address].   Other Instructions Your physician has requested that you have an echocardiogram. Echocardiography is a painless test that uses sound waves to create images of your heart. It provides your doctor with information about the size and shape of your heart and how well your hearts chambers and valves are working. This procedure takes  approximately one hour. There are no restrictions for this procedure. Please do NOT wear cologne, perfume, aftershave, or lotions (deodorant is allowed). Please arrive 15 minutes prior to your appointment time.  Please note: We ask at that you not bring children with you during ultrasound (echo/ vascular) testing. Due to room size and safety concerns, children are not allowed in the ultrasound rooms during exams. Our front office staff cannot provide observation of children in our lobby area while testing is being conducted. An adult accompanying a patient to their appointment will only be allowed in the ultrasound room at the discretion of the ultrasound technician under special circumstances. We apologize for any inconvenience.

## 2024-02-17 ENCOUNTER — Telehealth: Payer: Self-pay | Admitting: Cardiology

## 2024-02-17 ENCOUNTER — Other Ambulatory Visit: Payer: Self-pay | Admitting: Cardiology

## 2024-02-17 DIAGNOSIS — R931 Abnormal findings on diagnostic imaging of heart and coronary circulation: Secondary | ICD-10-CM

## 2024-02-17 DIAGNOSIS — I2510 Atherosclerotic heart disease of native coronary artery without angina pectoris: Secondary | ICD-10-CM

## 2024-02-17 DIAGNOSIS — E7800 Pure hypercholesterolemia, unspecified: Secondary | ICD-10-CM

## 2024-02-17 MED ORDER — NEXLIZET 180-10 MG PO TABS: 1.0000 | ORAL_TABLET | Freq: Every day | ORAL | 3 refills | Status: AC

## 2024-02-17 MED ORDER — ASPIRIN 81 MG PO TBEC: 81.0000 mg | DELAYED_RELEASE_TABLET | Freq: Every day | ORAL | 3 refills | Status: AC

## 2024-02-17 NOTE — Telephone Encounter (Signed)
" °*  STAT* If patient is at the pharmacy, call can be transferred to refill team.   1. Which medications need to be refilled? (please list name of each medication and dose if known) Bempedoic Acid-Ezetimibe (NEXLIZET ) 180-10 MG TABS   ASPIRIN  LOW DOSE 81 MG tablet    2. Would you like to learn more about the convenience, safety, & potential cost savings by using the [facility]? No      3. Are you open to using the [facility] (Type [facility]. No    4. Which pharmacy/location (including street and city if local pharmacy) is medication to be sent to? [id number] - , [place] - [address] [facility] [address]     5. Do they need a 30 day or 90 day supply? 90 day   "

## 2024-02-17 NOTE — Telephone Encounter (Signed)
 Refill sent

## 2024-11-30 ENCOUNTER — Ambulatory Visit (HOSPITAL_COMMUNITY)
